# Patient Record
Sex: Female | Born: 1955 | Race: White | Hispanic: No | Marital: Married | Smoking: Never smoker
Health system: Southern US, Community
[De-identification: ages and names within clinical notes are randomized; demographics above are authoritative.]

## PROBLEM LIST (undated history)

## (undated) DIAGNOSIS — I10 Essential (primary) hypertension: Secondary | ICD-10-CM

## (undated) DIAGNOSIS — Z8249 Family history of ischemic heart disease and other diseases of the circulatory system: Secondary | ICD-10-CM

## (undated) DIAGNOSIS — N63 Unspecified lump in unspecified breast: Secondary | ICD-10-CM

## (undated) DIAGNOSIS — E785 Hyperlipidemia, unspecified: Secondary | ICD-10-CM

## (undated) DIAGNOSIS — Z973 Presence of spectacles and contact lenses: Secondary | ICD-10-CM

## (undated) HISTORY — DX: Morbid (severe) obesity due to excess calories: E66.01

## (undated) HISTORY — DX: Hyperlipidemia, unspecified: E78.5

## (undated) HISTORY — PX: TONSILLECTOMY: SUR1361

## (undated) HISTORY — DX: Unspecified lump in unspecified breast: N63.0

## (undated) HISTORY — DX: Presence of spectacles and contact lenses: Z97.3

## (undated) HISTORY — DX: Family history of ischemic heart disease and other diseases of the circulatory system: Z82.49

## (undated) HISTORY — DX: Essential (primary) hypertension: I10

---

## 2004-11-27 ENCOUNTER — Ambulatory Visit: Payer: Self-pay | Admitting: Internal Medicine

## 2004-12-09 ENCOUNTER — Ambulatory Visit (HOSPITAL_COMMUNITY): Admission: RE | Admit: 2004-12-09 | Discharge: 2004-12-09 | Payer: Self-pay | Admitting: Internal Medicine

## 2005-01-15 ENCOUNTER — Ambulatory Visit: Payer: Self-pay | Admitting: Internal Medicine

## 2006-03-17 ENCOUNTER — Ambulatory Visit: Payer: Self-pay | Admitting: Internal Medicine

## 2006-11-08 ENCOUNTER — Ambulatory Visit: Payer: Self-pay | Admitting: Internal Medicine

## 2007-05-31 ENCOUNTER — Ambulatory Visit (HOSPITAL_COMMUNITY): Admission: RE | Admit: 2007-05-31 | Discharge: 2007-05-31 | Payer: Self-pay | Admitting: Family Medicine

## 2007-06-12 ENCOUNTER — Encounter: Admission: RE | Admit: 2007-06-12 | Discharge: 2007-06-12 | Payer: Self-pay | Admitting: Family Medicine

## 2008-10-31 ENCOUNTER — Encounter: Admission: RE | Admit: 2008-10-31 | Discharge: 2008-10-31 | Payer: Self-pay | Admitting: Family Medicine

## 2008-11-20 ENCOUNTER — Encounter: Admission: RE | Admit: 2008-11-20 | Discharge: 2008-11-20 | Payer: Self-pay | Admitting: Family Medicine

## 2008-12-18 ENCOUNTER — Encounter: Admission: RE | Admit: 2008-12-18 | Discharge: 2008-12-18 | Payer: Self-pay | Admitting: General Surgery

## 2010-06-21 ENCOUNTER — Encounter: Payer: Self-pay | Admitting: Family Medicine

## 2012-01-13 ENCOUNTER — Other Ambulatory Visit (INDEPENDENT_AMBULATORY_CARE_PROVIDER_SITE_OTHER): Payer: Self-pay | Admitting: General Surgery

## 2012-01-13 ENCOUNTER — Encounter (INDEPENDENT_AMBULATORY_CARE_PROVIDER_SITE_OTHER): Payer: Self-pay | Admitting: Surgery

## 2012-01-13 ENCOUNTER — Ambulatory Visit (INDEPENDENT_AMBULATORY_CARE_PROVIDER_SITE_OTHER): Payer: BC Managed Care – PPO | Admitting: Surgery

## 2012-01-13 VITALS — BP 126/82 | HR 72 | Temp 97.8°F | Resp 16 | Ht 66.0 in | Wt 252.1 lb

## 2012-01-13 DIAGNOSIS — E7800 Pure hypercholesterolemia, unspecified: Secondary | ICD-10-CM

## 2012-01-13 DIAGNOSIS — I10 Essential (primary) hypertension: Secondary | ICD-10-CM

## 2012-01-13 DIAGNOSIS — E139 Other specified diabetes mellitus without complications: Secondary | ICD-10-CM

## 2012-01-13 DIAGNOSIS — E119 Type 2 diabetes mellitus without complications: Secondary | ICD-10-CM

## 2012-01-13 DIAGNOSIS — E66813 Obesity, class 3: Secondary | ICD-10-CM

## 2012-01-13 DIAGNOSIS — Z1231 Encounter for screening mammogram for malignant neoplasm of breast: Secondary | ICD-10-CM

## 2012-01-13 NOTE — Patient Instructions (Signed)

## 2012-01-13 NOTE — Progress Notes (Signed)
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  She has read extensively on bariatric operations and has decided she would like a laparoscopic adjustable gastric band. She is followed by Dr. Dewain Penning and [place]. Her comorbidities include in addition to diabetes her hypertension, hypercholesterolemia. She was to proceed with workup for her without adjustable gastric banding. She has had no prior abdominal surgery. She has no history of DVT.  Past Medical History  Diagnosis Date  . Diabetes mellitus   . Hypertension   . Lump in female breast   . Wears glasses     for reading    History reviewed. No pertinent past surgical history.  Current Outpatient Prescriptions  Medication Sig Dispense Refill  . aspirin 81 MG tablet Take 81 mg by mouth daily.      . fenofibrate 160 MG tablet Take 160 mg by mouth daily.      . fexofenadine (ALLERGY RELIEF) 180 MG tablet Take 180 mg by mouth daily.      . fish oil-omega-3 fatty acids 1000 MG capsule Take 2 g by mouth 2 (two) times daily.      Marland Kitchen L-Lysine 500 MG TABS Take by mouth 2 (two) times daily.      Marland Kitchen lisinopril-hydrochlorothiazide (PRINZIDE,ZESTORETIC) 20-12.5 MG per tablet Take 1 tablet by mouth daily.      . Multiple Vitamins-Calcium (ONE-A-DAY WOMENS FORMULA PO) Take by mouth daily.      . Multiple Vitamins-Minerals (OCUVITE ADULT 50+ PO) Take by mouth 2 (two) times daily.      . pioglitazone-metformin (ACTOPLUS MET) 15-850 MG per tablet Take 1 tablet by mouth 3 (three) times daily.      . pravastatin (PRAVACHOL) 40 MG tablet Take 40 mg by mouth 2 (two) times daily.       Review of patient's allergies indicates no known allergies. Family History  Problem Relation Age of Onset  . Diabetes Mother   . Heart disease Mother     congestive heart failure  . COPD Mother   . Stroke Father   . Hyperlipidemia  Brother    Social History:   reports that she has never smoked. She has never used smokeless tobacco. She reports that she drinks alcohol. She reports that she does not use illicit drugs.   REVIEW OF SYSTEMS - PERTINENT POSITIVES ONLY: noncontributory  Physical Exam:   Blood pressure 126/82, pulse 72, temperature 97.8 F (36.6 C), temperature source Temporal, resp. rate 16, height 5\' 6"  (1.676 m), weight 252 lb 2 oz (114.363 kg). Body mass index is 40.69 kg/(m^2).  Gen:  WDWN WF NAD  Neurological: Alert and oriented to person, place, and time. Motor and sensory function is grossly intact  Head: Normocephalic and atraumatic.  Eyes: Conjunctivae are normal. Pupils are equal, round, and reactive to light. No scleral icterus.  Neck: Normal range of motion. Neck supple. No tracheal deviation or thyromegaly present.  Cardiovascular:  SR without murmurs or gallops.  No carotid bruits Respiratory: Effort normal.  No respiratory distress. No chest wall tenderness. Breath sounds normal.  No wheezes, rales or rhonchi.  Abdomen:  nontender GU: Musculoskeletal: Normal range of motion. Extremities are nontender. No cyanosis, edema or clubbing noted Lymphadenopathy: No cervical, preauricular, postauricular or axillary adenopathy is present Skin: Skin is warm and dry. No rash noted. No diaphoresis. No erythema.  No pallor. Pscyh: Normal mood and affect. Behavior is normal. Judgment and thought content normal.   LABORATORY RESULTS: No results found for this or any previous visit (from the past 48 hour(s)).  RADIOLOGY RESULTS: No results found.  Problem List: Patient Active Problem List  Diagnosis  . Type 2 DM for 10 years  . Hypercholesteremia  . Hypertension  . Obesity, Class III, BMI 40-49.9 (morbid obesity)    Assessment & Plan: Obesity BMI 40 DM Plan begin workup for lapband    [name], MD, [facility] [phone] beeper [phone]  01/13/2012  11:57 AM

## 2012-01-26 ENCOUNTER — Encounter: Payer: BC Managed Care – PPO | Attending: Surgery | Admitting: *Deleted

## 2012-01-26 ENCOUNTER — Other Ambulatory Visit (HOSPITAL_COMMUNITY): Payer: Self-pay

## 2012-01-26 ENCOUNTER — Encounter: Payer: Self-pay | Admitting: *Deleted

## 2012-01-26 ENCOUNTER — Ambulatory Visit (HOSPITAL_COMMUNITY): Payer: Self-pay

## 2012-01-26 ENCOUNTER — Other Ambulatory Visit (INDEPENDENT_AMBULATORY_CARE_PROVIDER_SITE_OTHER): Payer: Self-pay | Admitting: Surgery

## 2012-01-26 VITALS — Ht 65.5 in | Wt 249.4 lb

## 2012-01-26 DIAGNOSIS — Z01818 Encounter for other preprocedural examination: Secondary | ICD-10-CM | POA: Insufficient documentation

## 2012-01-26 DIAGNOSIS — Z713 Dietary counseling and surveillance: Secondary | ICD-10-CM | POA: Insufficient documentation

## 2012-01-26 LAB — CBC
Hemoglobin: 14.5 g/dL (ref 12.0–15.0)
MCH: 30.1 pg (ref 26.0–34.0)
MCV: 87.8 fL (ref 78.0–100.0)
Platelets: 262 10*3/uL (ref 150–400)
WBC: 5.9 10*3/uL (ref 4.0–10.5)

## 2012-01-26 LAB — COMPREHENSIVE METABOLIC PANEL
Albumin: 4.4 g/dL (ref 3.5–5.2)
BUN: 15 mg/dL (ref 6–23)
Chloride: 102 mEq/L (ref 96–112)
Creat: 0.98 mg/dL (ref 0.50–1.10)
Sodium: 140 mEq/L (ref 135–145)
Total Bilirubin: 0.6 mg/dL (ref 0.3–1.2)

## 2012-01-26 LAB — TSH: TSH: 1.615 u[IU]/mL (ref 0.350–4.500)

## 2012-01-26 LAB — HEMOGLOBIN A1C: Mean Plasma Glucose: 151 mg/dL — ABNORMAL HIGH (ref <117)

## 2012-01-26 NOTE — Progress Notes (Signed)
  Pre-Op Assessment Visit:  Pre-Operative LAGB Surgery  Medical Nutrition Therapy:  Appt start time: 0800   End time:  0915.  Patient was seen on 01/26/2012 for Pre-Operative LAGB Nutrition Assessment. Assessment and letter of approval faxed to [facility] Bariatric Surgery Program coordinator on 01/26/2012.  Approval letter sent to [facility] and will be available in the chart under the media tab.  Handouts given during visit include:  Pre-Op Goals   Bariatric Surgery Protein Shakes handout  Patient to call for Pre-Op and Post-Op Nutrition Education at [facility] when surgery is scheduled.

## 2012-01-26 NOTE — Patient Instructions (Addendum)
   Follow Pre-Op Nutrition Goals to prepare for Lapband Surgery.   Call the [facility] at [phone] once you have been given your surgery date to enrolled in the Pre-Op Nutrition Class. You will need to attend this nutrition class 3-4 weeks prior to your surgery. 

## 2012-02-09 ENCOUNTER — Other Ambulatory Visit: Payer: Self-pay

## 2012-02-09 ENCOUNTER — Ambulatory Visit (HOSPITAL_COMMUNITY)
Admission: RE | Admit: 2012-02-09 | Discharge: 2012-02-09 | Disposition: A | Discharge status: Home / Self Care | Payer: BC Managed Care – PPO | Source: Ambulatory Visit | Attending: Surgery | Admitting: Surgery

## 2012-02-09 DIAGNOSIS — I10 Essential (primary) hypertension: Secondary | ICD-10-CM

## 2012-02-09 DIAGNOSIS — Z13820 Encounter for screening for osteoporosis: Secondary | ICD-10-CM | POA: Insufficient documentation

## 2012-02-09 DIAGNOSIS — E119 Type 2 diabetes mellitus without complications: Secondary | ICD-10-CM

## 2012-02-09 DIAGNOSIS — E7800 Pure hypercholesterolemia, unspecified: Secondary | ICD-10-CM

## 2012-02-09 DIAGNOSIS — K449 Diaphragmatic hernia without obstruction or gangrene: Secondary | ICD-10-CM | POA: Insufficient documentation

## 2012-02-09 DIAGNOSIS — Z1231 Encounter for screening mammogram for malignant neoplasm of breast: Secondary | ICD-10-CM | POA: Insufficient documentation

## 2012-02-09 DIAGNOSIS — Z6841 Body Mass Index (BMI) 40.0 and over, adult: Secondary | ICD-10-CM | POA: Insufficient documentation

## 2012-02-16 ENCOUNTER — Ambulatory Visit (HOSPITAL_COMMUNITY)
Admission: RE | Admit: 2012-02-16 | Discharge: 2012-02-16 | Disposition: A | Discharge status: Home / Self Care | Payer: BC Managed Care – PPO | Source: Ambulatory Visit | Attending: Surgery | Admitting: Surgery

## 2012-02-16 ENCOUNTER — Encounter (HOSPITAL_COMMUNITY)
Admission: RE | Disposition: A | Discharge status: Home / Self Care | Payer: Self-pay | Source: Ambulatory Visit | Attending: Surgery

## 2012-02-16 DIAGNOSIS — Z01818 Encounter for other preprocedural examination: Secondary | ICD-10-CM | POA: Insufficient documentation

## 2012-02-16 HISTORY — PX: BREATH TEK H PYLORI: SHX5422

## 2012-02-16 SURGERY — BREATH TEST, FOR HELICOBACTER PYLORI

## 2012-02-17 ENCOUNTER — Encounter (HOSPITAL_COMMUNITY): Payer: Self-pay | Admitting: Surgery

## 2012-02-17 ENCOUNTER — Encounter (HOSPITAL_COMMUNITY): Payer: Self-pay

## 2012-03-23 ENCOUNTER — Institutional Professional Consult (permissible substitution): Payer: BC Managed Care – PPO | Admitting: Internal Medicine

## 2012-04-03 ENCOUNTER — Encounter: Payer: Self-pay | Admitting: Internal Medicine

## 2012-04-03 ENCOUNTER — Ambulatory Visit (INDEPENDENT_AMBULATORY_CARE_PROVIDER_SITE_OTHER): Payer: BC Managed Care – PPO | Admitting: Internal Medicine

## 2012-04-03 VITALS — BP 130/86 | HR 101 | Temp 97.7°F | Ht 65.5 in | Wt 232.4 lb

## 2012-04-03 DIAGNOSIS — R053 Chronic cough: Secondary | ICD-10-CM

## 2012-04-03 DIAGNOSIS — R05 Cough: Secondary | ICD-10-CM

## 2012-04-03 DIAGNOSIS — R059 Cough, unspecified: Secondary | ICD-10-CM

## 2012-04-03 NOTE — Patient Instructions (Addendum)
Your cough could have been related to lisinopril which we have now added in your allergy list In addition, you might have had a spike in acid reflux at that time due to your abdominal symptoms Not sure you had acute bronchitis It is not clear which of the many interventions have helped your cough Given improvement in a time frame that corresponds to stopping lisinopril, this was likely cause of cough For now as discussed continue to monitor your cough I would suggest you stop fish oil for a month and then resume If your cough does not go away in a month or comes back anytime, please call us for appt

## 2012-04-03 NOTE — Progress Notes (Signed)
Subjective:    Patient ID: Sandra Villa, female    DOB: 08-Dec-1955, 56 y.o.   MRN: [id number]  HPI PCP is SPEAR, TAMMY, MD  Body mass index is 38.09 kg/(m^2).  reports that she has never smoked. She has never used smokeless tobacco.   IOV 04/03/2012  56 year female. Referred for chronic cough   Insidious onset x 6 weeks ago. No preceding URI/cold. Started randomly. Went to an urgent care; Rx prilosec x 10 days but no help. Then went to PMD who gave empiric antibiotic x 5 days for acute bronchitis (she had acute bronchitis last year same time but had other infectious symptoms then but not now). This did not help. Then  approix 2 weeks ag- developed incidental abd pain nos and 14  days ago swa PMD. AT this visit ace inhibitor for bp stopped, given 3 day prednisone for abd issue and 10 days 2nd course of antibiotics nos that did not help that she finished yesterday 03/02/12. Now cough is improved and no more all night cough. At onset cough was severe. Now cough severity is mild. Quality of cough is dry all the time. Cough made worse by sleeping or lying down. Improved by up and about. No relationship to dust and fumes. There was no associated tickle in throat or gag but there was associated wheeze that is improving as well   RElevant hx  BP  - was on lisinopril x 8 years. Stopped after onset of cough 3 weeks ago. Since then on HCTZ  Sinus  - no known sinus issues acutely or chronically  Allergies  - no known allergies - sesonal or perennial  GI - not known to have GERD - she is due for bariatric surgery to lose weight (dr Daphine Deutscher). Sept 2013: UGI series: Small sliding hiatal hernia. No evidence of esophageal stricture or other significant abnormality. - note she is on fish  Oil for lipid, denies gerd symptoms   Pulmonary  - not known to have any pulmnary diagnosis and baseline chronic resp symptoms.. CXR Sept 2013:  clear   Dr Gretta Cool Reflux Symptom Index (> 13-15 suggestive of LPR  cough) 04/03/2012   Hoarseness of problem with voice 0  Clearing  Of Throat 2  Excess throat mucus or feeling of post nasal drip 0  Difficulty swallowing food, liquid or tablets 0  Cough after eating or lying down 4  Breathing difficulties or choking episodes 0  Troublesome or annoying cough 4  Sensation of something sticking in throat or lump in throat 0  Heartburn, chest pain, indigestion, or stomach acid coming up 0  TOTAL 10     Kouffman Reflux v Neurogenic Cough Differentiator Reflux Comments  Do you awaken from a sound sleep coughing violently?                            With trouble breathing? n   Do you have choking episodes when you cannot  Get enough air, gasping for air ?              n   Do you usually cough when you lie down into  The bed, or when you just lie down to rest ?                          Yes   Do you usually cough after meals or eating?  no   Do you cough when (or after) you bend over?    no   GERD SCORE  1   Kouffman Reflux v Neurogenic Cough Differentiator Neurogenic   Do you more-or-less cough all day long? n   Does change of temperature make you cough? n   Does laughing or chuckling cause you to cough? n   Do fumes (perfume, automobile fumes, burned  Toast, etc.,) cause you to cough ?      n   Does speaking, singing, or talking on the phone cause you to cough   ?               n   Neurogenic/Airway score 0      Past Medical History  Diagnosis Date  . Diabetes mellitus   . Hypertension   . Lump in female breast   . Wears glasses     for reading  . Morbid obesity   . Hyperlipidemia     Pt reported on 01/26/12     Family History  Problem Relation Age of Onset  . Diabetes Mother   . Heart disease Mother     congestive heart failure  . COPD Mother   . Stroke Father   . Hyperlipidemia Brother      History   Social History  . Marital Status: Married    Spouse Name: N/A    Number of Children: N/A  . Years of Education: N/A    Occupational History  . Not on file.   Social History Main Topics  . Smoking status: Never Smoker   . Smokeless tobacco: Never Used  . Alcohol Use: Yes     Comment: rarely - maybe once per month if that  . Drug Use: No  . Sexually Active: Not on file   Other Topics Concern  . Not on file   Social History Narrative  . No narrative on file     Allergies  Allergen Reactions  . Niacin And Related Rash     Outpatient Prescriptions Prior to Visit  Medication Sig Dispense Refill  . aspirin 81 MG tablet Take 81 mg by mouth daily.      . fenofibrate 160 MG tablet Take 160 mg by mouth daily.      . fish oil-omega-3 fatty acids 1000 MG capsule Take 2 g by mouth 2 (two) times daily.      Marland Kitchen L-Lysine 500 MG TABS Take by mouth 2 (two) times daily.      . Multiple Vitamins-Calcium (ONE-A-DAY WOMENS FORMULA PO) Take by mouth daily.      . Multiple Vitamins-Minerals (OCUVITE ADULT 50+ PO) Take by mouth 2 (two) times daily.      . pioglitazone-metformin (ACTOPLUS MET) 15-850 MG per tablet Take 1 tablet by mouth 3 (three) times daily.      . pravastatin (PRAVACHOL) 40 MG tablet Take 40 mg by mouth 2 (two) times daily.      . [DISCONTINUED] fexofenadine (ALLERGY RELIEF) 180 MG tablet Take 180 mg by mouth daily.      . [DISCONTINUED] lisinopril-hydrochlorothiazide (PRINZIDE,ZESTORETIC) 20-12.5 MG per tablet Take 1 tablet by mouth daily.       Last reviewed on 04/03/2012 11:12 AM by Darrell Jewel, CMA    Review of Systems  Constitutional: Negative for fever and unexpected weight change.  HENT: Negative for ear pain, nosebleeds, congestion, sore throat, rhinorrhea, sneezing, trouble swallowing, dental problem, postnasal drip and sinus pressure.   Eyes: Negative for redness  and itching.  Respiratory: Positive for cough. Negative for chest tightness, shortness of breath and wheezing.   Cardiovascular: Negative for palpitations and leg swelling.  Gastrointestinal: Positive for abdominal  pain. Negative for nausea and vomiting.  Genitourinary: Negative for dysuria.  Musculoskeletal: Negative for joint swelling.  Skin: Negative for rash.  Neurological: Negative for headaches.  Hematological: Does not bruise/bleed easily.  Psychiatric/Behavioral: Negative for dysphoric mood. The patient is not nervous/anxious.        Objective:   Physical Exam  Vitals reviewed. Constitutional: She is oriented to person, place, and time. She appears well-developed and well-nourished. No distress.       Body mass index is 38.09 kg/(m^2).   HENT:  Head: Normocephalic and atraumatic.  Right Ear: External ear normal.  Left Ear: External ear normal.  Mouth/Throat: Oropharynx is clear and moist. No oropharyngeal exudate.  Eyes: Conjunctivae normal and EOM are normal. Pupils are equal, round, and reactive to light. Right eye exhibits no discharge. Left eye exhibits no discharge. No scleral icterus.  Neck: Normal range of motion. Neck supple. No JVD present. No tracheal deviation present. No thyromegaly present.  Cardiovascular: Normal rate, regular rhythm, normal heart sounds and intact distal pulses.  Exam reveals no gallop and no friction rub.   No murmur heard. Pulmonary/Chest: Effort normal and breath sounds normal. No respiratory distress. She has no wheezes. She has no rales. She exhibits no tenderness.  Abdominal: Soft. Bowel sounds are normal. She exhibits no distension and no mass. There is no tenderness. There is no rebound and no guarding.  Musculoskeletal: Normal range of motion. She exhibits no edema and no tenderness.  Lymphadenopathy:    She has no cervical adenopathy.  Neurological: She is alert and oriented to person, place, and time. She has normal reflexes. No cranial nerve deficit. She exhibits normal muscle tone. Coordination normal.  Skin: Skin is warm and dry. No rash noted. She is not diaphoretic. No erythema. No pallor.  Psychiatric: She has a normal mood and affect. Her  behavior is normal. Judgment and thought content normal.          Assessment & Plan:

## 2012-04-16 DIAGNOSIS — R053 Chronic cough: Secondary | ICD-10-CM | POA: Insufficient documentation

## 2012-04-16 DIAGNOSIS — R05 Cough: Secondary | ICD-10-CM | POA: Insufficient documentation

## 2012-04-16 NOTE — Assessment & Plan Note (Signed)
Your cough could have been related to lisinopril which we have now added in your allergy list In addition, you might have had a spike in acid reflux at that time due to your abdominal symptoms Not sure you had acute bronchitis It is not clear which of the many interventions have helped your cough Given improvement in a time frame that corresponds to stopping lisinopril, this was likely cause of cough For now as discussed continue to monitor your cough I would suggest you stop fish oil for a month and then resume If your cough does not go away in a month or comes back anytime, please call us for appt 

## 2012-05-04 ENCOUNTER — Encounter: Payer: BC Managed Care – PPO | Attending: Surgery | Admitting: *Deleted

## 2012-05-04 DIAGNOSIS — Z713 Dietary counseling and surveillance: Secondary | ICD-10-CM | POA: Insufficient documentation

## 2012-05-04 DIAGNOSIS — Z01818 Encounter for other preprocedural examination: Secondary | ICD-10-CM | POA: Insufficient documentation

## 2012-05-04 NOTE — Progress Notes (Signed)
Bariatric Class:  Appt start time: 1730 end time:  1830.  Pre-Operative Nutrition Class  Patient was seen on 05/04/12 for Pre-Operative Bariatric Surgery Education at [facility].   Surgery date: 05/22/12 Surgery type: LAGB Start weight at [facility]: 249.4 lb (01/26/12) Goal weight: 140 lbs  Weight today: TBA Weight change:  TBA Total weight lost:  TBA BMI:  TBA  Samples given per MNT protocol: Bariatric Advantage Multivitamin Lot # [id number];  Exp: 06/15  Bariatric Advantage Calcium Citrate Lot # [id number];  Exp: 12/13 (*Pt alerted to upcoming expiration date)  Celebrate Vitamins Multivitamin Lot # [id number];  Exp: 07/14  Celebrate Vitamins Iron + C (18 mg) Lot # [id number];  Exp: 03/15  Celebrate Vitamins Calcium Citrate Lot # [id number];  Exp: 08/15  Unjury Protein Shake Lot # [id number];  Exp: 02/15  The following the learning objective met by the patient during this course:  Identifies Pre-Op Dietary Goals and will begin 2 weeks pre-operatively  Identifies appropriate sources of fluids and proteins   States protein recommendations and appropriate sources pre and post-operatively  Identifies Post-Operative Dietary Goals and will follow for 2 weeks post-operatively  Identifies appropriate multivitamin and calcium sources  Describes the need for physical activity post-operatively and will follow MD recommendations  States when to call healthcare provider regarding medication questions or post-operative complications  Handouts given during class include:  Pre-Op Bariatric Surgery Diet Handout  Protein Shake Handout  Post-Op Bariatric Surgery Nutrition Handout  BELT Program Information Flyer  Support Group Information Flyer  [facility] Bariatric Supplements Price List  Follow-Up Plan: Patient will follow-up at [facility] 2 weeks post operatively for diet advancement per MD.

## 2012-05-06 NOTE — Patient Instructions (Signed)
Follow:   Pre-Op Diet per MD 2 weeks prior to surgery  Phase 2- Liquids (clear/full) 2 weeks after surgery  Vitamin/Mineral/Calcium guidelines for purchasing bariatric supplements  Exercise guidelines pre and post-op per MD  Follow-up at [facility] in 2 weeks post-op for diet advancement. Contact Seham Gardenhire as needed with questions/concerns. 

## 2012-05-12 ENCOUNTER — Encounter (HOSPITAL_COMMUNITY): Payer: Self-pay | Admitting: Pharmacy Technician

## 2012-05-12 ENCOUNTER — Other Ambulatory Visit (INDEPENDENT_AMBULATORY_CARE_PROVIDER_SITE_OTHER): Payer: Self-pay | Admitting: Surgery

## 2012-05-16 ENCOUNTER — Encounter (HOSPITAL_COMMUNITY): Payer: Self-pay

## 2012-05-16 ENCOUNTER — Encounter (HOSPITAL_COMMUNITY)
Admission: RE | Admit: 2012-05-16 | Discharge: 2012-05-16 | Disposition: A | Discharge status: Home / Self Care | Payer: BC Managed Care – PPO | Source: Ambulatory Visit | Attending: Surgery | Admitting: Surgery

## 2012-05-16 LAB — SURGICAL PCR SCREEN: MRSA, PCR: NEGATIVE

## 2012-05-16 LAB — CBC
HCT: 45.7 % (ref 36.0–46.0)
Hemoglobin: 15.3 g/dL — ABNORMAL HIGH (ref 12.0–15.0)
MCH: 29.3 pg (ref 26.0–34.0)
MCHC: 33.5 g/dL (ref 30.0–36.0)
MCV: 87.4 fL (ref 78.0–100.0)

## 2012-05-16 LAB — BASIC METABOLIC PANEL
BUN: 25 mg/dL — ABNORMAL HIGH (ref 6–23)
Calcium: 10.9 mg/dL — ABNORMAL HIGH (ref 8.4–10.5)
Creatinine, Ser: 0.82 mg/dL (ref 0.50–1.10)
GFR calc non Af Amer: 79 mL/min — ABNORMAL LOW (ref >90)
Glucose, Bld: 129 mg/dL — ABNORMAL HIGH (ref 70–99)

## 2012-05-16 NOTE — Progress Notes (Signed)
Abnormal BMET results routed to Dr. Daphine Deutscher via [facility]

## 2012-05-16 NOTE — Patient Instructions (Addendum)
20 Sandra Villa  05/16/2012   Your procedure is scheduled on: 05/22/12  Report to [facility] at 12:45 PM.  Call this number if you have problems the morning of surgery [phone]: [phone]   Remember: follow bowl prep instructions and complete fleets enema night of 05/21/12   Do not eat food After Midnight 05/21/12 clear liquids from midnight until 0915 on 05/22/12 then nothing.     Take these medicines the morning of surgery with A SIP OF WATER: pravastatin    Do not wear jewelry, make-up or nail polish.  Do not wear lotions, powders, or perfumes. You may wear deodorant.  Do not shave 48 hours prior to surgery. Men may shave face and neck.  Do not bring valuables to the hospital.  Contacts, dentures or bridgework may not be worn into surgery.  Leave suitcase in the car. After surgery it may be brought to your room.  For patients admitted to the hospital, checkout time is 11:00 AM the day of discharge.     Special Instructions: Shower using CHG 2 nights before surgery and the night before surgery.  If you shower the day of surgery use CHG.  Use special wash - you have one bottle of CHG for all showers.  You should use approximately 1/3 of the bottle for each shower.   Please read over the following fact sheets that you were given: MRSA Information, clear liquids fact sheet Birdie Sons, RN  pre op nurse call if needed [phone]    FAILURE TO FOLLOW THESE INSTRUCTIONS MAY RESULT IN CANCELLATION OF YOUR SURGERY   Patient Signature: ___________________________________________

## 2012-05-16 NOTE — Progress Notes (Signed)
Chest x-ray 02/09/12 on EP[facility], EKG 02/09/12 on EP[facility]

## 2012-05-16 NOTE — Progress Notes (Signed)
05/16/12 0837  OBSTRUCTIVE SLEEP APNEA  Have you ever been diagnosed with sleep apnea through a sleep study? No  Do you snore loudly (loud enough to be heard through closed doors)?  1  Do you often feel tired, fatigued, or sleepy during the daytime? 0  Has anyone observed you stop breathing during your sleep? 0  Do you have, or are you being treated for high blood pressure? 1  BMI more than 35 kg/m2? 1  Age over 55 years old? 1  Neck circumference greater than 40 cm/18 inches? 0  Gender: 0  Obstructive Sleep Apnea Score 4   Score 4 or greater  Results sent to PCP

## 2012-05-19 ENCOUNTER — Ambulatory Visit (INDEPENDENT_AMBULATORY_CARE_PROVIDER_SITE_OTHER): Payer: BC Managed Care – PPO | Admitting: Surgery

## 2012-05-19 ENCOUNTER — Encounter (INDEPENDENT_AMBULATORY_CARE_PROVIDER_SITE_OTHER): Payer: Self-pay | Admitting: Surgery

## 2012-05-19 VITALS — BP 114/76 | HR 99 | Temp 97.0°F | Ht 66.0 in | Wt 222.0 lb

## 2012-05-19 DIAGNOSIS — E669 Obesity, unspecified: Secondary | ICD-10-CM

## 2012-05-19 NOTE — Patient Instructions (Signed)

## 2012-05-19 NOTE — Progress Notes (Signed)
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  She has read extensively on bariatric operations and has decided she would like a laparoscopic adjustable gastric band. She is followed by Sandra Villa and Sandra Villa. Her comorbidities include in addition to diabetes her hypertension, hypercholesterolemia. She was to proceed with workup for her without adjustable gastric banding. She has had no prior abdominal surgery. She has no history of DVT. She had an upper GI that showed: Small sliding hiatal hernia. No evidence of esophageal stricture  or other significant abnormality. She has no complaints of hearburn.    Past Medical History  Diagnosis Date  . Hypertension   . Lump in female breast   . Wears glasses     for reading  . Morbid obesity   . Hyperlipidemia     Pt reported on 01/26/12  . Diabetes mellitus     type 2    Past Surgical History  Procedure Date  . Breath tek h pylori 02/16/2012    Procedure: BREATH TEK H PYLORI;  Surgeon: Sandra Mccalla B Derico Mitton, MD;  Location: [facility];  Service: General;  Laterality: N/A;  . Tonsillectomy 56years old    Current Outpatient Prescriptions  Medication Sig Dispense Refill  . calcium carbonate (OS-CAL) 600 MG TABS Take 600 mg by mouth 3 (three) times daily with meals.      . fenofibrate 160 MG tablet Take 160 mg by mouth daily before breakfast.       . hydrochlorothiazide (HYDRODIURIL) 25 MG tablet Take 1 tablet by mouth daily before breakfast.       . L-Lysine 500 MG TABS Take by mouth 2 (two) times daily.      . Multiple Vitamin (MULTIVITAMIN) LIQD Take 15 mLs by mouth daily.      . Multiple Vitamins-Minerals (OCUVITE ADULT 50+ PO) Take by mouth 2 (two) times daily.      . pioglitazone-metformin (ACTOPLUS MET) 15-850 MG per tablet Take 1 tablet by mouth 2 (two) times daily with a meal.       . pravastatin  (PRAVACHOL) 40 MG tablet Take 40 mg by mouth 2 (two) times daily.       Lisinopril and Niacin and related Family History  Problem Relation Age of Onset  . Diabetes Mother   . Heart disease Mother     congestive heart failure  . COPD Mother   . Stroke Father   . Hyperlipidemia Brother    Social History:   reports that she has never smoked. She has never used smokeless tobacco. She reports that she drinks alcohol. She reports that she does not use illicit drugs.   REVIEW OF SYSTEMS - PERTINENT POSITIVES ONLY: noncontributory  Physical Exam:   Blood pressure 114/76, pulse 99, temperature 97 F (36.1 C), temperature source Temporal, height 5' 6" (1.676 m), weight 222 lb (100.699 kg), SpO2 96.00%. Body mass index is 35.83 kg/(m^2).  Gen:  WDWN WF NAD  Neurological: Alert and oriented to person, place, and time. Motor and sensory function is grossly intact  Head: Normocephalic and atraumatic.  Eyes: Conjunctivae are normal. Pupils are equal, round, and reactive to light. No scleral icterus.  Neck: Normal range of motion. Neck supple. No tracheal deviation or thyromegaly present.  Cardiovascular:  SR without murmurs or gallops.  No carotid bruits Respiratory: Effort normal.  No   respiratory distress. No chest wall tenderness. Breath sounds normal.  No wheezes, rales or rhonchi.  Abdomen:  nontender GU: Musculoskeletal: Normal range of motion. Extremities are nontender. No cyanosis, edema or clubbing noted Lymphadenopathy: No cervical, preauricular, postauricular or axillary adenopathy is present Skin: Skin is warm and dry. No rash noted. No diaphoresis. No erythema. No pallor. Pscyh: Normal mood and affect. Behavior is normal. Judgment and thought content normal.   LABORATORY RESULTS: No results found for this or any previous visit (from the past 48 hour(s)).  RADIOLOGY RESULTS: No results found.  Problem List: Patient Active Problem List  Diagnosis  . Type 2 DM for 10 years  .  Hypercholesteremia  . Hypertension  . Obesity, Class III, BMI 40-49.9 (morbid obesity)  . Chronic cough    Assessment & Plan: She has done well and has lost from a BMI of 40 down to about BMI of 35. She has a small sliding hiatal hernia on upper GI and doesn't complain of GERD. Plan laparoscopic adjustable gastric banding. She's aware the procedure. Gallbladder ultrasound was negative.  Sandra B. Blakelyn Dinges, MD, FACS  [facility] [phone] beeper [phone]  05/19/2012 11:11 AM     

## 2012-05-22 ENCOUNTER — Observation Stay (HOSPITAL_COMMUNITY)
Admission: RE | Admit: 2012-05-22 | Discharge: 2012-05-23 | Disposition: A | Discharge status: Home / Self Care | Payer: BC Managed Care – PPO | Source: Ambulatory Visit | Attending: Surgery | Admitting: Surgery

## 2012-05-22 ENCOUNTER — Ambulatory Visit (HOSPITAL_COMMUNITY): Payer: BC Managed Care – PPO | Admitting: Anesthesiology

## 2012-05-22 ENCOUNTER — Encounter (HOSPITAL_COMMUNITY)
Admission: RE | Disposition: A | Discharge status: Home / Self Care | Payer: Self-pay | Source: Ambulatory Visit | Attending: Surgery

## 2012-05-22 ENCOUNTER — Encounter (HOSPITAL_COMMUNITY): Payer: Self-pay | Admitting: Anesthesiology

## 2012-05-22 ENCOUNTER — Encounter (HOSPITAL_COMMUNITY): Payer: Self-pay | Admitting: *Deleted

## 2012-05-22 DIAGNOSIS — Z6835 Body mass index (BMI) 35.0-35.9, adult: Secondary | ICD-10-CM

## 2012-05-22 DIAGNOSIS — E119 Type 2 diabetes mellitus without complications: Secondary | ICD-10-CM | POA: Insufficient documentation

## 2012-05-22 DIAGNOSIS — K449 Diaphragmatic hernia without obstruction or gangrene: Secondary | ICD-10-CM | POA: Insufficient documentation

## 2012-05-22 DIAGNOSIS — I10 Essential (primary) hypertension: Secondary | ICD-10-CM

## 2012-05-22 DIAGNOSIS — E669 Obesity, unspecified: Secondary | ICD-10-CM

## 2012-05-22 DIAGNOSIS — Z01812 Encounter for preprocedural laboratory examination: Secondary | ICD-10-CM | POA: Insufficient documentation

## 2012-05-22 DIAGNOSIS — Z79899 Other long term (current) drug therapy: Secondary | ICD-10-CM | POA: Insufficient documentation

## 2012-05-22 DIAGNOSIS — E7800 Pure hypercholesterolemia, unspecified: Secondary | ICD-10-CM | POA: Insufficient documentation

## 2012-05-22 HISTORY — PX: LAPAROSCOPIC GASTRIC BANDING: SHX1100

## 2012-05-22 HISTORY — PX: INSERTION OF MESH: SHX5868

## 2012-05-22 LAB — CBC
HCT: 41.8 % (ref 36.0–46.0)
Hemoglobin: 14.2 g/dL (ref 12.0–15.0)
MCHC: 34 g/dL (ref 30.0–36.0)
MCV: 86 fL (ref 78.0–100.0)
RDW: 13.4 % (ref 11.5–15.5)
WBC: 6.6 10*3/uL (ref 4.0–10.5)

## 2012-05-22 LAB — GLUCOSE, CAPILLARY
Glucose-Capillary: 169 mg/dL — ABNORMAL HIGH (ref 70–99)
Glucose-Capillary: 160 mg/dL — ABNORMAL HIGH (ref 70–99)
Glucose-Capillary: 108 mg/dL — ABNORMAL HIGH (ref 70–99)

## 2012-05-22 LAB — CREATININE, SERUM: GFR calc Af Amer: 72 mL/min — ABNORMAL LOW (ref >90)

## 2012-05-22 SURGERY — GASTRIC BANDING, LAPAROSCOPIC
Anesthesia: General | Site: Abdomen | Wound class: Clean

## 2012-05-22 MED ORDER — INSULIN ASPART 100 UNIT/ML ~~LOC~~ SOLN
0.0000 [IU] | SUBCUTANEOUS | Status: DC
  Administered 2012-05-22 (×2): 4 [IU] via SUBCUTANEOUS

## 2012-05-22 MED ORDER — ACETAMINOPHEN 10 MG/ML IV SOLN
INTRAVENOUS | Status: DC | PRN
  Administered 2012-05-22: 1000 mg via INTRAVENOUS

## 2012-05-22 MED ORDER — CISATRACURIUM BESYLATE (PF) 10 MG/5ML IV SOLN
INTRAVENOUS | Status: DC | PRN
  Administered 2012-05-22: 8 mg via INTRAVENOUS

## 2012-05-22 MED ORDER — UNJURY CHOCOLATE CLASSIC POWDER: 2.0000 [oz_av] | Freq: Four times a day (QID) | ORAL | Status: DC

## 2012-05-22 MED ORDER — UNJURY VANILLA POWDER
2.0000 [oz_av] | Freq: Four times a day (QID) | ORAL | Status: DC
  Administered 2012-05-23: 2 [oz_av] via ORAL

## 2012-05-22 MED ORDER — INSULIN GLARGINE 100 UNIT/ML ~~LOC~~ SOLN
5.0000 [IU] | Freq: Every day | SUBCUTANEOUS | Status: DC
  Administered 2012-05-22: 5 [IU] via SUBCUTANEOUS

## 2012-05-22 MED ORDER — CEFOXITIN SODIUM-DEXTROSE 1-4 GM-% IV SOLR (PREMIX)
INTRAVENOUS | Status: AC
  Filled 2012-05-22: qty 100

## 2012-05-22 MED ORDER — ACETAMINOPHEN 10 MG/ML IV SOLN
1000.0000 mg | Freq: Four times a day (QID) | INTRAVENOUS | Status: DC
  Administered 2012-05-22 – 2012-05-23 (×3): 1000 mg via INTRAVENOUS
  Filled 2012-05-22 (×6): qty 100

## 2012-05-22 MED ORDER — NEOSTIGMINE METHYLSULFATE 1 MG/ML IJ SOLN
INTRAMUSCULAR | Status: DC | PRN
  Administered 2012-05-22: 4 mg via INTRAVENOUS

## 2012-05-22 MED ORDER — ONDANSETRON HCL 4 MG/2ML IJ SOLN
4.0000 mg | INTRAMUSCULAR | Status: DC | PRN
  Administered 2012-05-22: 4 mg via INTRAVENOUS
  Filled 2012-05-22: qty 2

## 2012-05-22 MED ORDER — OXYCODONE HCL 5 MG/5ML PO SOLN
5.0000 mg | Freq: Once | ORAL | Status: DC | PRN
  Filled 2012-05-22: qty 5

## 2012-05-22 MED ORDER — UNJURY CHICKEN SOUP POWDER: 2.0000 [oz_av] | Freq: Four times a day (QID) | ORAL | Status: DC

## 2012-05-22 MED ORDER — GLYCOPYRROLATE 0.2 MG/ML IJ SOLN
INTRAMUSCULAR | Status: DC | PRN
  Administered 2012-05-22: .6 mg via INTRAVENOUS

## 2012-05-22 MED ORDER — BUPIVACAINE LIPOSOME 1.3 % IJ SUSP
INTRAMUSCULAR | Status: DC | PRN
  Administered 2012-05-22: 20 mL

## 2012-05-22 MED ORDER — ACETAMINOPHEN 160 MG/5ML PO SOLN: 650.0000 mg | ORAL | Status: DC | PRN

## 2012-05-22 MED ORDER — ACETAMINOPHEN 10 MG/ML IV SOLN
INTRAVENOUS | Status: AC
  Filled 2012-05-22: qty 100

## 2012-05-22 MED ORDER — DEXAMETHASONE SODIUM PHOSPHATE 10 MG/ML IJ SOLN
INTRAMUSCULAR | Status: DC | PRN
  Administered 2012-05-22: 10 mg via INTRAVENOUS

## 2012-05-22 MED ORDER — LACTATED RINGERS IV SOLN
INTRAVENOUS | Status: DC | PRN
  Administered 2012-05-22 (×2): via INTRAVENOUS

## 2012-05-22 MED ORDER — MEPERIDINE HCL 50 MG/ML IJ SOLN: 6.2500 mg | INTRAMUSCULAR | Status: DC | PRN

## 2012-05-22 MED ORDER — PROMETHAZINE HCL 25 MG/ML IJ SOLN: 6.2500 mg | INTRAMUSCULAR | Status: DC | PRN

## 2012-05-22 MED ORDER — OXYCODONE-ACETAMINOPHEN 5-325 MG/5ML PO SOLN: 5.0000 mL | ORAL | Status: DC | PRN

## 2012-05-22 MED ORDER — MORPHINE SULFATE 2 MG/ML IJ SOLN: 2.0000 mg | INTRAMUSCULAR | Status: DC | PRN

## 2012-05-22 MED ORDER — FLEET ENEMA 7-19 GM/118ML RE ENEM: 1.0000 | ENEMA | Freq: Once | RECTAL | Status: DC

## 2012-05-22 MED ORDER — ONDANSETRON HCL 4 MG/2ML IJ SOLN
INTRAMUSCULAR | Status: DC | PRN
  Administered 2012-05-22: 4 mg via INTRAVENOUS

## 2012-05-22 MED ORDER — HYDROMORPHONE HCL PF 1 MG/ML IJ SOLN: 0.2500 mg | INTRAMUSCULAR | Status: DC | PRN

## 2012-05-22 MED ORDER — DEXTROSE 5 % IV SOLN
2.0000 g | INTRAVENOUS | Status: AC
  Administered 2012-05-22: 2 g via INTRAVENOUS
  Filled 2012-05-22: qty 2

## 2012-05-22 MED ORDER — SODIUM CHLORIDE 0.9 % IV SOLN
INTRAVENOUS | Status: DC
  Administered 2012-05-22: 100 mL via INTRAVENOUS

## 2012-05-22 MED ORDER — PROPOFOL 10 MG/ML IV BOLUS
INTRAVENOUS | Status: DC | PRN
  Administered 2012-05-22: 200 mg via INTRAVENOUS

## 2012-05-22 MED ORDER — LIDOCAINE HCL (CARDIAC) 20 MG/ML IV SOLN
INTRAVENOUS | Status: DC | PRN
  Administered 2012-05-22: 100 mg via INTRAVENOUS

## 2012-05-22 MED ORDER — HEPARIN SODIUM (PORCINE) 5000 UNIT/ML IJ SOLN
5000.0000 [IU] | Freq: Three times a day (TID) | INTRAMUSCULAR | Status: DC
  Administered 2012-05-22 – 2012-05-23 (×2): 5000 [IU] via SUBCUTANEOUS
  Filled 2012-05-22 (×5): qty 1

## 2012-05-22 MED ORDER — HEPARIN SODIUM (PORCINE) 5000 UNIT/ML IJ SOLN
5000.0000 [IU] | Freq: Once | INTRAMUSCULAR | Status: AC
  Administered 2012-05-22: 5000 [IU] via SUBCUTANEOUS
  Filled 2012-05-22: qty 1

## 2012-05-22 MED ORDER — SUCCINYLCHOLINE CHLORIDE 20 MG/ML IJ SOLN
INTRAMUSCULAR | Status: DC | PRN
  Administered 2012-05-22: 100 mg via INTRAVENOUS

## 2012-05-22 MED ORDER — MIDAZOLAM HCL 5 MG/5ML IJ SOLN
INTRAMUSCULAR | Status: DC | PRN
  Administered 2012-05-22: 2 mg via INTRAVENOUS

## 2012-05-22 MED ORDER — SUFENTANIL CITRATE 50 MCG/ML IV SOLN
INTRAVENOUS | Status: DC | PRN
  Administered 2012-05-22 (×2): 10 ug via INTRAVENOUS
  Administered 2012-05-22: 20 ug via INTRAVENOUS
  Administered 2012-05-22: 10 ug via INTRAVENOUS

## 2012-05-22 MED ORDER — SODIUM CHLORIDE 0.9 % IJ SOLN
INTRAMUSCULAR | Status: DC | PRN
  Administered 2012-05-22: 20 mL via INTRAVENOUS

## 2012-05-22 MED ORDER — BUPIVACAINE LIPOSOME 1.3 % IJ SUSP
20.0000 mL | Freq: Once | INTRAMUSCULAR | Status: DC
  Filled 2012-05-22: qty 20

## 2012-05-22 MED ORDER — ACETAMINOPHEN 10 MG/ML IV SOLN: 1000.0000 mg | Freq: Once | INTRAVENOUS | Status: DC | PRN

## 2012-05-22 MED ORDER — OXYCODONE HCL 5 MG PO TABS: 5.0000 mg | ORAL_TABLET | Freq: Once | ORAL | Status: DC | PRN

## 2012-05-22 SURGICAL SUPPLY — 60 items
BAND LAP 10.0 W/TUBES (Band) ×3 IMPLANT
BENZOIN TINCTURE PRP APPL 2/3 (GAUZE/BANDAGES/DRESSINGS) IMPLANT
BLADE HEX COATED 2.75 (ELECTRODE) ×3 IMPLANT
BLADE SURG 15 STRL LF DISP TIS (BLADE) ×2 IMPLANT
BLADE SURG 15 STRL SS (BLADE) ×1
CANISTER SUCTION 2500CC (MISCELLANEOUS) ×3 IMPLANT
CLOTH BEACON ORANGE TIMEOUT ST (SAFETY) ×3 IMPLANT
COVER SURGICAL LIGHT HANDLE (MISCELLANEOUS) ×3 IMPLANT
DECANTER SPIKE VIAL GLASS SM (MISCELLANEOUS) ×6 IMPLANT
DEVICE SUT QUICK LOAD TK 5 (STAPLE) ×15 IMPLANT
DEVICE SUT TI-KNOT TK 5X26 (MISCELLANEOUS) ×3 IMPLANT
DEVICE SUTURE ENDOST 10MM (ENDOMECHANICALS) ×3 IMPLANT
DISSECTOR BLUNT TIP ENDO 5MM (MISCELLANEOUS) ×3 IMPLANT
DRAPE CAMERA CLOSED 9X96 (DRAPES) ×3 IMPLANT
ELECT REM PT RETURN 9FT ADLT (ELECTROSURGICAL) ×3
ELECTRODE REM PT RTRN 9FT ADLT (ELECTROSURGICAL) ×2 IMPLANT
GLOVE BIOGEL M 8.0 STRL (GLOVE) ×3 IMPLANT
GLOVE BIOGEL PI IND STRL 7.0 (GLOVE) ×2 IMPLANT
GLOVE BIOGEL PI INDICATOR 7.0 (GLOVE) ×1
GOWN STRL NON-REIN LRG LVL3 (GOWN DISPOSABLE) ×6 IMPLANT
GOWN STRL REIN XL XLG (GOWN DISPOSABLE) ×9 IMPLANT
HOVERMATT SINGLE USE (MISCELLANEOUS) ×3 IMPLANT
KIT BASIN OR (CUSTOM PROCEDURE TRAY) ×3 IMPLANT
MESH HERNIA 1X4 RECT BARD (Mesh General) ×2 IMPLANT
MESH HERNIA BARD 1X4 (Mesh General) ×1 IMPLANT
NEEDLE SPNL 22GX3.5 QUINCKE BK (NEEDLE) ×3 IMPLANT
NS IRRIG 1000ML POUR BTL (IV SOLUTION) ×3 IMPLANT
PACK UNIVERSAL I (CUSTOM PROCEDURE TRAY) ×3 IMPLANT
PENCIL BUTTON HOLSTER BLD 10FT (ELECTRODE) ×3 IMPLANT
SCALPEL HARMONIC ACE (MISCELLANEOUS) IMPLANT
SET IRRIG TUBING LAPAROSCOPIC (IRRIGATION / IRRIGATOR) IMPLANT
SHEARS CURVED HARMONIC AC 45CM (MISCELLANEOUS) IMPLANT
SLEEVE ADV FIXATION 5X100MM (TROCAR) ×3 IMPLANT
SLEEVE Z-THREAD 5X100MM (TROCAR) IMPLANT
SOLUTION ANTI FOG 6CC (MISCELLANEOUS) ×3 IMPLANT
SPONGE GAUZE 4X4 12PLY (GAUZE/BANDAGES/DRESSINGS) ×3 IMPLANT
SPONGE LAP 18X18 X RAY DECT (DISPOSABLE) ×3 IMPLANT
STAPLER VISISTAT 35W (STAPLE) ×3 IMPLANT
STRIP CLOSURE SKIN 1/2X4 (GAUZE/BANDAGES/DRESSINGS) IMPLANT
SUT ETHIBOND 2 0 SH (SUTURE) ×3
SUT ETHIBOND 2 0 SH 36X2 (SUTURE) ×6 IMPLANT
SUT PROLENE 2 0 CT2 30 (SUTURE) ×3 IMPLANT
SUT SILK 0 (SUTURE) ×1
SUT SILK 0 30XBRD TIE 6 (SUTURE) ×2 IMPLANT
SUT SURGIDAC NAB ES-9 0 48 120 (SUTURE) ×3 IMPLANT
SUT VIC AB 2-0 SH 27 (SUTURE)
SUT VIC AB 2-0 SH 27X BRD (SUTURE) IMPLANT
SUT VIC AB 4-0 SH 18 (SUTURE) ×3 IMPLANT
SYR 20CC LL (SYRINGE) ×3 IMPLANT
SYR 30ML LL (SYRINGE) ×3 IMPLANT
SYS KII OPTICAL ACCESS 15MM (TROCAR) ×3
SYSTEM KII OPTICAL ACCESS 15MM (TROCAR) ×2 IMPLANT
TOWEL OR 17X26 10 PK STRL BLUE (TOWEL DISPOSABLE) ×6 IMPLANT
TROCAR ADV FIXATION 11X100MM (TROCAR) IMPLANT
TROCAR XCEL NON-BLD 11X100MML (ENDOMECHANICALS) ×3 IMPLANT
TROCAR Z-THREAD FIOS 11X100 BL (TROCAR) IMPLANT
TROCAR Z-THREAD FIOS 5X100MM (TROCAR) ×3 IMPLANT
TROCAR Z-THREAD SLEEVE 11X100 (TROCAR) ×3 IMPLANT
TUBE CALIBRATION LAPBAND (TUBING) ×3 IMPLANT
TUBING INSUFFLATION 10FT LAP (TUBING) ×3 IMPLANT

## 2012-05-22 NOTE — H&P (View-Only) (Signed)
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  She has read extensively on bariatric operations and has decided she would like a laparoscopic adjustable gastric band. She is followed by Dr. Dewain Penning and [place]. Her comorbidities include in addition to diabetes her hypertension, hypercholesterolemia. She was to proceed with workup for her without adjustable gastric banding. She has had no prior abdominal surgery. She has no history of DVT. She had an upper GI that showed: Small sliding hiatal hernia. No evidence of esophageal stricture  or other significant abnormality. She has no complaints of hearburn.    Past Medical History  Diagnosis Date  . Hypertension   . Lump in female breast   . Wears glasses     for reading  . Morbid obesity   . Hyperlipidemia     Pt reported on 01/26/12  . Diabetes mellitus     type 2    Past Surgical History  Procedure Date  . Breath tek h pylori 02/16/2012    Procedure: BREATH TEK H PYLORI;  Surgeon: Valarie Merino, MD;  Location: [facility];  Service: General;  Laterality: N/A;  . Tonsillectomy 56years old    Current Outpatient Prescriptions  Medication Sig Dispense Refill  . calcium carbonate (OS-CAL) 600 MG TABS Take 600 mg by mouth 3 (three) times daily with meals.      . fenofibrate 160 MG tablet Take 160 mg by mouth daily before breakfast.       . hydrochlorothiazide (HYDRODIURIL) 25 MG tablet Take 1 tablet by mouth daily before breakfast.       . L-Lysine 500 MG TABS Take by mouth 2 (two) times daily.      . Multiple Vitamin (MULTIVITAMIN) LIQD Take 15 mLs by mouth daily.      . Multiple Vitamins-Minerals (OCUVITE ADULT 50+ PO) Take by mouth 2 (two) times daily.      . pioglitazone-metformin (ACTOPLUS MET) 15-850 MG per tablet Take 1 tablet by mouth 2 (two) times daily with a meal.       . pravastatin  (PRAVACHOL) 40 MG tablet Take 40 mg by mouth 2 (two) times daily.       Lisinopril and Niacin and related Family History  Problem Relation Age of Onset  . Diabetes Mother   . Heart disease Mother     congestive heart failure  . COPD Mother   . Stroke Father   . Hyperlipidemia Brother    Social History:   reports that she has never smoked. She has never used smokeless tobacco. She reports that she drinks alcohol. She reports that she does not use illicit drugs.   REVIEW OF SYSTEMS - PERTINENT POSITIVES ONLY: noncontributory  Physical Exam:   Blood pressure 114/76, pulse 99, temperature 97 F (36.1 C), temperature source Temporal, height 5\' 6"  (1.676 m), weight 222 lb (100.699 kg), SpO2 96.00%. Body mass index is 35.83 kg/(m^2).  Gen:  WDWN WF NAD  Neurological: Alert and oriented to person, place, and time. Motor and sensory function is grossly intact  Head: Normocephalic and atraumatic.  Eyes: Conjunctivae are normal. Pupils are equal, round, and reactive to light. No scleral icterus.  Neck: Normal range of motion. Neck supple. No tracheal deviation or thyromegaly present.  Cardiovascular:  SR without murmurs or gallops.  No carotid bruits Respiratory: Effort normal.  No  respiratory distress. No chest wall tenderness. Breath sounds normal.  No wheezes, rales or rhonchi.  Abdomen:  nontender GU: Musculoskeletal: Normal range of motion. Extremities are nontender. No cyanosis, edema or clubbing noted Lymphadenopathy: No cervical, preauricular, postauricular or axillary adenopathy is present Skin: Skin is warm and dry. No rash noted. No diaphoresis. No erythema. No pallor. Pscyh: Normal mood and affect. Behavior is normal. Judgment and thought content normal.   LABORATORY RESULTS: No results found for this or any previous visit (from the past 48 hour(s)).  RADIOLOGY RESULTS: No results found.  Problem List: Patient Active Problem List  Diagnosis  . Type 2 DM for 10 years  .  Hypercholesteremia  . Hypertension  . Obesity, Class III, BMI 40-49.9 (morbid obesity)  . Chronic cough    Assessment & Plan: She has done well and has lost from a BMI of 40 down to about BMI of 35. She has a small sliding hiatal hernia on upper GI and doesn't complain of GERD. Plan laparoscopic adjustable gastric banding. She's aware the procedure. Gallbladder ultrasound was negative.  [name], MD, [facility] [phone] beeper [phone]  05/19/2012 11:11 AM

## 2012-05-22 NOTE — Anesthesia Postprocedure Evaluation (Signed)
Anesthesia Post Note  Patient: Sandra Villa  Procedure(s) Performed: Procedure(s) (LRB): LAPAROSCOPIC GASTRIC BANDING (N/A) INSERTION OF MESH ()  Anesthesia type: General  Patient location: PACU  Post pain: Pain level controlled  Post assessment: Post-op Vital signs reviewed  Last Vitals: BP 134/69  Pulse 74  Temp 36.6 C (Oral)  Resp 16  Ht 5\' 6"  (1.676 m)  Wt 217 lb 6 oz (98.601 kg)  BMI 35.09 kg/m2  SpO2 93%  Post vital signs: Reviewed  Level of consciousness: sedated  Complications: No apparent anesthesia complications

## 2012-05-22 NOTE — Op Note (Signed)
05/22/2012  Surgeon: Wenda Low, MD, FACS Asst:  Lodema Pilot, DO  Procedure: Laparoscopic adjustable gastric banding with APS band and 1 suture closure of the hiatus posteriorally  Anes:  General  EBL:  Minimal  Description of Procedure  The patient was taken to OR # 1 and given general anesthesia.  After a prep with PCMX the patient was draped and a timeout performed.  Access to the abdomen was achieved with a 0 degree Optiview technique through the left upper quadrant.    Adhesions were not present.  Ports were placed to the the right of the midline including a 15 trocar in  the right upper quadrant placed obliquely.  The Satira Mccallum was used to retract the left lateral segment and the peritoneum was incised along the left crus.   [name].  A balloon test was negative but UGI showed a small hiatus hernia.  She denied GERD.    The pars flaccida technique was utilized to insert the blunt "finger " dissector from right to left behind the stomach.  This created a target zone to pass the band passer.     The lapband APS  Had been previously flushed and was inserted through the 15 trocar.  It was placed in the tip of "the finger"  and pulled around behind the stomach.   The band was plicated with 3 sutures of surgidec secured with Ty Knots.  The tubing was brought out through the lower incision on the right and connected to the port which had mesh sewn onto the back and was placed into the subcutaneous pocket.  The incisions were injected with Exparel and closed with 4-0 vicryl and  Dermabond.     The patient was taken to the PACU in stable condition.    [name], MD, [facility], [place] [phone]

## 2012-05-22 NOTE — Interval H&P Note (Signed)
History and Physical Interval Note:  05/22/2012 1:03 PM  Sandra Villa  has presented today for surgery, with the diagnosis of morbid obesity   The various methods of treatment have been discussed with the patient and family. After consideration of risks, benefits and other options for treatment, the patient has consented to  Procedure(s) (LRB) with comments: LAPAROSCOPIC GASTRIC BANDING (N/A) as a surgical intervention .  The patient's history has been reviewed, patient examined, no change in status, stable for surgery.  I have reviewed the patient's chart and labs.  Questions were answered to the patient's satisfaction.     Giovanni Bath B

## 2012-05-22 NOTE — Transfer of Care (Signed)
Immediate Anesthesia Transfer of Care Note  Patient: Sandra Villa  Procedure(s) Performed: Procedure(s) (LRB) with comments: LAPAROSCOPIC GASTRIC BANDING (N/A) INSERTION OF MESH ()  Patient Location: PACU  Anesthesia Type:General  Level of Consciousness: awake  Airway & Oxygen Therapy: Patient Spontanous Breathing and Patient connected to face mask oxygen  Post-op Assessment: Report given to PACU RN and Post -op Vital signs reviewed and stable  Post vital signs: Reviewed and stable  Complications: No apparent anesthesia complications

## 2012-05-22 NOTE — Anesthesia Preprocedure Evaluation (Addendum)
Anesthesia Evaluation  Patient identified by MRN, date of birth, ID band Patient awake    Reviewed: Allergy & Precautions, H&P , NPO status , Patient's Chart, lab work & pertinent test results  Airway Mallampati: II TM Distance: >3 FB Neck ROM: Full    Dental  (+) Dental Advisory Given and Teeth Intact   Pulmonary neg pulmonary ROS,  breath sounds clear to auscultation  Pulmonary exam normal       Cardiovascular hypertension, Pt. on medications - Past MI and - CHF Rhythm:Regular Rate:Normal     Neuro/Psych negative neurological ROS  negative psych ROS   GI/Hepatic negative GI ROS, Neg liver ROS,   Endo/Other  diabetes, Type 2, Oral Hypoglycemic AgentsMorbid obesity  Renal/GU negative Renal ROS     Musculoskeletal negative musculoskeletal ROS (+)   Abdominal (+) + obese,   Peds  Hematology negative hematology ROS (+)   Anesthesia Other Findings   Reproductive/Obstetrics                          Anesthesia Physical Anesthesia Plan  ASA: III  Anesthesia Plan: General   Post-op Pain Management:    Induction: Intravenous  Airway Management Planned: Oral ETT  Additional Equipment:   Intra-op Plan:   Post-operative Plan: Extubation in OR  Informed Consent: I have reviewed the patients History and Physical, chart, labs and discussed the procedure including the risks, benefits and alternatives for the proposed anesthesia with the patient or authorized representative who has indicated his/her understanding and acceptance.   Dental advisory given  Plan Discussed with: CRNA  Anesthesia Plan Comments:         Anesthesia Quick Evaluation

## 2012-05-22 NOTE — Anesthesia Procedure Notes (Signed)
Procedure Name: Intubation Date/Time: 05/22/2012 1:44 PM Performed by: Leroy Libman L Patient Re-evaluated:Patient Re-evaluated prior to inductionOxygen Delivery Method: Circle system utilized Preoxygenation: Pre-oxygenation with 100% oxygen Intubation Type: IV induction Ventilation: Mask ventilation without difficulty and Oral airway inserted - appropriate to patient size Laryngoscope Size: Hyacinth Meeker and 2 Grade View: Grade I Tube type: Oral Tube size: 7.5 mm Number of attempts: 1 Airway Equipment and Method: Stylet Placement Confirmation: ETT inserted through vocal cords under direct vision,  breath sounds checked- equal and bilateral and positive ETCO2 Secured at: 21 cm Tube secured with: Tape Dental Injury: Teeth and Oropharynx as per pre-operative assessment

## 2012-05-22 NOTE — Preoperative (Signed)
Beta Blockers   Reason not to administer Beta Blockers:Not Applicable, not on home BB 

## 2012-05-23 ENCOUNTER — Encounter (HOSPITAL_COMMUNITY): Payer: Self-pay | Admitting: Surgery

## 2012-05-23 ENCOUNTER — Observation Stay (HOSPITAL_COMMUNITY): Payer: BC Managed Care – PPO

## 2012-05-23 DIAGNOSIS — Z09 Encounter for follow-up examination after completed treatment for conditions other than malignant neoplasm: Secondary | ICD-10-CM

## 2012-05-23 LAB — GLUCOSE, CAPILLARY
Glucose-Capillary: 107 mg/dL — ABNORMAL HIGH (ref 70–99)
Glucose-Capillary: 99 mg/dL (ref 70–99)

## 2012-05-23 LAB — CBC WITH DIFFERENTIAL/PLATELET
Basophils Relative: 0 % (ref 0–1)
HCT: 38.9 % (ref 36.0–46.0)
Hemoglobin: 13.4 g/dL (ref 12.0–15.0)
Lymphocytes Relative: 16 % (ref 12–46)
Lymphs Abs: 1.2 10*3/uL (ref 0.7–4.0)
MCHC: 34.4 g/dL (ref 30.0–36.0)
Monocytes Absolute: 0.8 10*3/uL (ref 0.1–1.0)
Monocytes Relative: 10 % (ref 3–12)
Neutro Abs: 5.4 10*3/uL (ref 1.7–7.7)
RBC: 4.57 MIL/uL (ref 3.87–5.11)

## 2012-05-23 MED ORDER — IOHEXOL 300 MG/ML  SOLN: 20.0000 mL | Freq: Once | INTRAMUSCULAR | Status: DC | PRN

## 2012-05-23 MED ORDER — BIOTENE DRY MOUTH MT LIQD: 15.0000 mL | Freq: Two times a day (BID) | OROMUCOSAL | Status: DC

## 2012-05-23 MED ORDER — CHLORHEXIDINE GLUCONATE 0.12 % MT SOLN
15.0000 mL | Freq: Two times a day (BID) | OROMUCOSAL | Status: DC
  Administered 2012-05-23 (×2): 15 mL via OROMUCOSAL
  Filled 2012-05-23 (×4): qty 15

## 2012-05-23 MED ORDER — OXYCODONE-ACETAMINOPHEN 5-325 MG/5ML PO SOLN: 5.0000 mL | ORAL | Status: DC | PRN

## 2012-05-23 NOTE — Progress Notes (Signed)
*  Preliminary Results* Bilateral lower extremity venous duplex completed. Bilateral lower extremities are negative for deep vein thrombosis. No evidence of Baker's cyst bilaterally.  05/23/2012 10:17 AM Gertie Fey, RDMS, RDCS

## 2012-05-23 NOTE — Discharge Summary (Signed)
Physician Discharge Summary  Patient ID: Sandra Villa MRN: [id number] DOB/AGE: 55-Sep-1957 55 y.o.  Admit date: 05/22/2012 Discharge date: 05/23/2012  Admission Diagnoses:  obesity  Discharge Diagnoses:  same  Active Problems:  * No active hospital problems. *    Surgery:  Lapband APS  Discharged Condition: improved  Hospital Course:   Had surgery and kept overnight.  Ready for discharge  Consults: none  Significant Diagnostic Studies: UGI and DVT study    Discharge Exam: Blood pressure 128/83, pulse 89, temperature 98.5 F (36.9 C), temperature source Oral, resp. rate 18, height 5\' 6"  (1.676 m), weight 217 lb 6 oz (98.601 kg), SpO2 95.00%. Minimal pain.  Disposition: 01-Home or Self Care  Discharge Orders    Future Appointments: Provider: Department: Dept Phone: Center:   06/02/2012 10:30 AM Valarie Merino, MD [facility], [place] [phone] None   06/06/2012 4:00 PM [facility] Post-Op Class [facility] [phone] NDM     Future Orders Please Complete By Expires   Diet - low sodium heart healthy      Increase activity slowly      No wound care          Medication List     As of 05/23/2012  9:29 AM    TAKE these medications         calcium carbonate 600 MG Tabs   Commonly known as: OS-CAL   Take 600 mg by mouth 3 (three) times daily with meals.      fenofibrate 160 MG tablet   Take 160 mg by mouth daily before breakfast.      hydrochlorothiazide 25 MG tablet   Commonly known as: HYDRODIURIL   Take 1 tablet by mouth daily before breakfast.      L-Lysine 500 MG Tabs   Take by mouth 2 (two) times daily.      multivitamin Liqd   Take 15 mLs by mouth daily.      OCUVITE ADULT 50+ PO   Take by mouth 2 (two) times daily.      oxyCODONE-acetaminophen 5-325 MG/5ML solution   Commonly known as: ROXICET   Take 5-10 mLs by mouth every 4 (four) hours as needed.      pioglitazone-metformin 15-850 MG per  tablet   Commonly known as: ACTOPLUS MET   Take 1 tablet by mouth 2 (two) times daily with a meal.      pravastatin 40 MG tablet   Commonly known as: PRAVACHOL   Take 40 mg by mouth 2 (two) times daily.           Follow-up Information    Follow up with Valarie Merino, MD.   Contact information:   [address] [place] [address] [phone]          Signed: Valarie Merino 05/23/2012, 9:29 AM

## 2012-05-23 NOTE — Progress Notes (Signed)
Pt alert and oriented; VSS; denies any nausea or vomiting; tolerating water well; burping; denies flatus or BM; voiding without difficulty; ambulating in hallways without difficulty; using incentive spirometer as directed; c/o some abdominal soreness with relief from prn meds; husband at bedside; doppler study negative; awaiting UGI; pt already has follow up appts with [facility] and [facility]; aware of support group and BELT program; discharge instructions reviewed and pt and husband verbalized understanding of; questions answered. ADJUSTABLE GASTRIC BAND DISCHARGE INSTRUCTIONS  Drs. Fredrik Rigger, [place], [place], and [place] Call if you have any problems.   Call [phone] and ask for the surgeon on call.    If you need immediate assistance come to the ER at [facility]. Tell the ER personnel that you are a new post-op gastric banding patient. Signs and symptoms to report:   Severe vomiting or nausea. If you cannot tolerate clear liquids for longer than 1 day, you need to call your surgeon.    Abdominal pain which does not get better after taking your pain medication   Fever greater than 101 F degree   Difficulty breathing   Chest pain    Redness, swelling, drainage, or foul odor at incision sites    If your incisions open or pull apart   Swelling or pain in calf (lower leg)   Diarrhea, frequent watery, uncontrolled bowel movements.   Constipation, (no bowel movements for 3 days) if this occurs, Take Milk of Magnesia, 2 tablespoons by mouth, 3 times a day for 2 days if needed.  Call your doctor if constipation continues. Stop taking Milk of Magnesia once you have had a bowel movement. You may also use Miralax according to the label instructions.   Anything you consider "abnormal for you".   Normal side effects after Surgery:   Unable to sleep at night or concentrate   Irritability   Being tearful (crying) or depressed   These are common complaints, possibly related to your anesthesia, stress of  surgery and change in lifestyle, that usually go away a few weeks after surgery.  If these feelings continue, call your medical doctor.  Wound Care You may have surgical glue, steri-strips, or staples over your incisions after surgery.  Surgical glue:  Looks like a clear film over your incisions and will wear off gradually. Steri-strips: Strips of tape over your incisions. You may notice a yellowish color on the skin underneath the steri-strips. This is a substance used to make the steri-strips stick better. Do not pull the steri-strips off - let them fall off. Staples: Cherlynn Polo may be removed before you leave the hospital. If you go home with staples, call [facility] [phone]) for an appointment with your surgeon's nurse to have staples removed in 7 - 10 days. Showering: You may shower two days after your surgery unless otherwise instructed by your surgeon. Wash gently around wounds with warm soapy water, rinse well, and gently pat dry.  If you have a drain, you may need someone to hold this while you shower. Avoid tub baths until staples are removed and incisions are healed.    Medications   Medications should be liquid or crushed if larger than the size of a dime.  Extended release pills should not be crushed.   Depending on the size and number of medications you take, you may need to stagger/change the time you take your medications so that you do not over-fill your pouch.    Make sure you follow-up with your primary care physician  to make medication adjustments needed during rapid weight loss and life-style adjustment.   If you are diabetic, follow up with the doctor that prescribes your diabetes medication(s) within one week after surgery and check your blood sugar regularly.   Do not drive while taking narcotics!   Do not take acetaminophen (Tylenol) and Roxicet or Lortab Elixir at the same time since these pain medications contain acetaminophen.  Diet at home: (First 2  Weeks)  You will see the nutritionist two weeks after your surgery. She will advance your diet if you are tolerating liquids well. Once at home, if you have severe vomiting or nausea and cannot tolerate clear liquids lasting longer than 1 day, call your surgeon.  For Same Day Surgery Discharge Patients: The day of surgery drink water only: 2 ounces every 4 hours. If you are tolerating water, begin drinking your high protein shake the next morning. For Overnight Stay Patients: Begin high protein shake 2 ounces every 3 hours, 5 - 6 times per day.  Gradually increase the amount you drink as tolerated.  You may find it easier to slowly sip shakes throughout the day.  It is important to get your proteins in first.   Protein Shake   Drink at least 2 ounces of shake 5-6 times per day   Each serving of protein shakes should have a minimum of 15 grams of protein and no more than 5 grams of carbohydrate    Increase the amount of protein shake you drink as tolerated   Protein powder may be added to fluids such as non-fat milk or Lactaid milk (limit to 20 grams added protein powder per serving   The initial goal is to drink at least 8 ounces of protein shake/drink per day (or as directed by the nutritionist). Some examples of protein shakes are [employer], [employer], EAS Edge HP, and Unjury. Hydration   Gradually increase the amount of water and other liquids as tolerated (See Acceptable Fluids)   Gradually increase the amount of protein shake as tolerated     Sip fluids slowly and throughout the day   May use Sugar substitutes, use sparingly (limit to 6 - 8 packets per day).  Your fluid goal is 64 ounces of fluid daily. It may take a few weeks to build up to this.         32 oz (or more) should be clear liquids and 32 oz (or more) should be full liquids.         Liquids should not contain sugar, caffeine, or carbonation!  Acceptable Fluids Clear Liquids:   Water or Sugar-free flavored water,  Fruit H2O   Decaffeinated coffee or tea (sugar-free)   Crystal Lite, Wyler's Lite, Minute Maid Lite   Sugar-free Jell-O   Bouillon or broth   Sugar-free Popsicle:   *Less than 20 calories each; Limit 1 per day   Full Liquids:              Protein Shakes/Drinks + 2 choices per day of other full liquids shown below.    Other full liquids must be: No more than 12 grams of Carbs per serving,  No more than 3 grams of Fat per serving   Strained low-fat cream soup   Non-Fat milk   Fat-free Lactaid Milk   Sugar-free yogurt (Dannon Lite & Fit) Vitamins and Minerals (Start 1 day after surgery unless otherwise directed)   1 Chewable Multivitamin / Multimineral Supplement (i.e. Centrum for Adults)  Chewable Calcium Citrate with Vitamin D-3. Take 1500 mg each day.           (Example: 3 Chewable Calcium Plus 600 with Vitamin D-3 can be found at [facility])           Do not mix multivitamins containing iron with calcium supplements; take 2 hours   apart   Do not substitute Tums (calcium carbonate) for your calcium   Menstruating women and those at risk for anemia may need extra iron. Talk with your doctor to see if you need additional iron.     If you need extra iron:  Total daily Iron recommendations (including Vitamins) = 50 - 100 mg Iron/day Do not stop taking or change any vitamins or minerals until you talk to your nutritionist or surgeon. Your nutritionist and / or physician must approve all vitamin and mineral supplements. Exercise For maximum success, begin exercising as soon as your doctor recommends. Make sure your physician approves any physical activity.   Depending on fitness level, begin with a simple walking program   Walk 5-15 minutes each day, 7 days per week.    Slowly increase until you are walking 30-45 minutes per day   Consider joining our BELT program. [phone] or email belt@[employer] .edu Things to remember:   You may have sexual relations when you feel comfortable. It is VERY  important for female patients to use a reliable birth control method. Fertility often increases after surgery. Do not get pregnant for at least 18 months.   It is very important to keep all follow up appointments with your surgeon, nutritionist, primary care physician, and behavioral health practitioner. After the first year, please follow up with your bariatric surgeon at least once a year in order to maintain best weight loss results.  [facility]: [phone] [facility]: [phone]   Free counseling is available for you and your family through collaboration between [facility] and [place]. Please call [phone] and leave a message.    Consider purchasing a medical alert bracelet that says you had lap-band surgery.    [facility] has a free Bariatric Surgery Support Group that meets monthly, the 3rd Thursday, 6 pm, Classroom #1, [employer]. You may register online at [email or web address], but registration is not necessary. Select Classes and Support Groups, Bariatric Surgery, or Call [phone]   Do not return to work or drive until cleared by your surgeon   Use your CPAP when sleeping if applicable   Do not lift anything greater than ten pounds for at least two weeks.   You will probably have your first fill (fluid added to your band) 6 weeks after surgery  Joen Laura, RN Bariatric Nurse Coordinator

## 2012-05-23 NOTE — Care Management Note (Signed)
    Page 1 of 1   05/23/2012     11:56:23 AM   CARE MANAGEMENT NOTE 05/23/2012  Patient:  Sandra Villa, Sandra Villa   Account Number:  [id number]  Date Initiated:  05/23/2012  Documentation initiated by:  Lorenda Ishihara  Subjective/Objective Assessment:   56 yo female admitted s/p lap banding procedure.     Action/Plan:   Home when stable   Anticipated DC Date:  05/23/2012   Anticipated DC Plan:  HOME/SELF CARE      DC Planning Services  CM consult      Choice offered to / List presented to:             Status of service:  Completed, signed off Medicare Important Message given?   (If response is "NO", the following Medicare IM given date fields will be blank) Date Medicare IM given:   Date Additional Medicare IM given:    Discharge Disposition:  HOME/SELF CARE  Per UR Regulation:  Reviewed for med. necessity/level of care/duration of stay  If discussed at Long Length of Stay Meetings, dates discussed:    Comments:

## 2012-05-23 NOTE — Progress Notes (Signed)
Dr. Daphine Deutscher notified or UGI results; orders received to discharge pt to home; pt and RN aware of results and orders. Joen Laura, RN Bariatric Nurse Coordinator

## 2012-06-02 ENCOUNTER — Ambulatory Visit (INDEPENDENT_AMBULATORY_CARE_PROVIDER_SITE_OTHER): Payer: Managed Care, Other (non HMO) | Admitting: Surgery

## 2012-06-02 ENCOUNTER — Telehealth (INDEPENDENT_AMBULATORY_CARE_PROVIDER_SITE_OTHER): Payer: Self-pay

## 2012-06-02 ENCOUNTER — Encounter (INDEPENDENT_AMBULATORY_CARE_PROVIDER_SITE_OTHER): Payer: Self-pay | Admitting: Surgery

## 2012-06-02 VITALS — BP 132/80 | HR 96 | Temp 97.1°F | Resp 16 | Ht 65.5 in | Wt 212.8 lb

## 2012-06-02 DIAGNOSIS — Z9884 Bariatric surgery status: Secondary | ICD-10-CM

## 2012-06-02 NOTE — Telephone Encounter (Signed)
ERROR - did not call pt

## 2012-06-02 NOTE — Telephone Encounter (Signed)
LMOM letting the pt know that her 1st LBF appt with MM is scheduled for Thursday-Jan 30@1150am .

## 2012-06-02 NOTE — Patient Instructions (Signed)
Laparoscopic Gastric Band Surgery Care After These instructions give you information on caring for yourself after your procedure. Your doctor may also give you more specific instructions. Call your doctor if you have any problems or questions after your procedure. HOME CARE   Take walks throughout the day. Do not sit for longer than 1 hour while awake for 4 to 6 weeks.  You may shower 2 days after surgery. Pat the surgery cuts (incisions) dry. Do not rub the surgery cuts.  Do your coughing and deep breathing exercises.  Do not lift, push, or pull anything heavy until your doctor says it is okay.  Only take medicines as told by your doctor. Do not drive while taking pain medicine.  Drink plenty of fluids to keep your pee (urine) clear or pale yellow.  Stay on a clear liquid diet as long as your doctor tells you to.  Do not drink caffeine for 1 month.  Change bandages (dressings) as told by your doctor.  Check your surgery cuts for redness, pufffiness (swelling), abnormal coloring, fluid, or bleeding.  Follow your doctor's advice about vitamin and protein needs after surgery. GET HELP RIGHT AWAY IF:  You feel sick to your stomach (nauseous) and throw up (vomit).  You have pain and discomfort with swallowing.  You develop shortness of breath or difficulty breathing.  You have pain, puffiness, or feel warmth on your lower body.  You have very bad calf pain or pain not relieved by medicine.  You have a temperature by mouth above 102 F (38.9 C).  Your surgery cuts look red, puffy, or they leak fluid.  Your poop (stool) is black, tarry, or dark red.  You have chills.  You have chest pain.  You feel confused.  You have slurred speech.  You feel lightheaded when standing.  You suddenly feel weak.  You have any questions or concerns. MAKE SURE YOU:   Understand these instructions.  Will watch your condition.  Will get help right away if you are not doing well or  get worse. Document Released: 06/19/2010 Document Revised: 08/09/2011 Document Reviewed: 06/19/2010 [facility] Patient Information 2013 [place], [place].

## 2012-06-02 NOTE — Progress Notes (Signed)
Sandra Villa 57 y.o.  Body mass index is 34.87 kg/(m^2).  Patient Active Problem List  Diagnosis  . Type 2 DM for 10 years  . Hypercholesteremia  . Hypertension  . Chronic cough  . Obesity (BMI 30-39.9)    Allergies  Allergen Reactions  . Lisinopril     Cough   . Niacin And Related Rash    Past Surgical History  Procedure Date  . Breath tek h pylori 02/16/2012    Procedure: BREATH TEK H PYLORI;  Surgeon: Valarie Merino, MD;  Location: [facility];  Service: General;  Laterality: N/A;  . Tonsillectomy 57years old  . Laparoscopic gastric banding 05/22/2012    Procedure: LAPAROSCOPIC GASTRIC BANDING;  Surgeon: Valarie Merino, MD;  Location: [facility];  Service: General;  Laterality: N/A;  . Insertion of mesh 05/22/2012    Procedure: INSERTION OF MESH;  Surgeon: Valarie Merino, MD;  Location: [facility];  Service: General;;   [name], MD No diagnosis found.  Returns after her lapband on 23Dec.  She is doing well.  Incisions healing fine.   Due for first fill the second week in February.   [name], MD, [facility] [phone] beeper [phone]  06/02/2012 10:43 AM

## 2012-06-06 ENCOUNTER — Encounter: Payer: Managed Care, Other (non HMO) | Attending: Surgery | Admitting: *Deleted

## 2012-06-06 VITALS — Ht 65.5 in | Wt 211.3 lb

## 2012-06-06 DIAGNOSIS — Z713 Dietary counseling and surveillance: Secondary | ICD-10-CM | POA: Insufficient documentation

## 2012-06-06 DIAGNOSIS — E669 Obesity, unspecified: Secondary | ICD-10-CM

## 2012-06-06 DIAGNOSIS — Z01818 Encounter for other preprocedural examination: Secondary | ICD-10-CM | POA: Insufficient documentation

## 2012-06-06 NOTE — Patient Instructions (Addendum)
Patient to follow Phase 3A-Soft, High Protein Diet and follow-up at [facility] in 6 weeks for 2 months post-op nutrition visit for diet advancement. 

## 2012-06-07 ENCOUNTER — Encounter: Payer: Self-pay | Admitting: *Deleted

## 2012-06-07 NOTE — Progress Notes (Signed)
Bariatric Class:  Appt start time: 1600 end time:  1700.  2 Week Post-Operative Nutrition Class  Patient was seen on 06/07/11 for Post-[facility]ative [facility]   Surgery date: 05/22/12  Surgery type: LAGB  Start weight at [facility]: 249.4 lb (01/26/12)    Weight today: 211.3 lbs  Weight change: 38.1 lbs Total weight lost: 38.1 lbs  BMI: 34.6  The following the learning objectives were met by the patient during this course:  Identifies Phase 3A (Soft, High Proteins) Dietary Goals and will begin from 2 weeks post-operatively to 2 months post-operatively  Identifies appropriate sources of fluids and proteins   States protein recommendations and appropriate sources post-operatively  Identifies the need for appropriate texture modifications, mastication, and bite sizes when consuming solids  Identifies appropriate multivitamin and calcium sources post-operatively  Describes the need for physical activity post-operatively and will follow MD recommendations  States when to call healthcare provider regarding medication questions or post-operative complications  Handouts given during class include:  Phase 3A: Soft, High Protein Diet Handout  Band Fill Guidelines Handout  Follow-Up Plan: Patient will follow-up at [facility] in 6 weeks for 2 months post-op nutrition visit for diet advancement per MD.

## 2012-06-26 HISTORY — PX: OTHER SURGICAL HISTORY: SHX169

## 2012-06-29 ENCOUNTER — Encounter (INDEPENDENT_AMBULATORY_CARE_PROVIDER_SITE_OTHER): Payer: Self-pay | Admitting: Surgery

## 2012-06-29 ENCOUNTER — Ambulatory Visit (INDEPENDENT_AMBULATORY_CARE_PROVIDER_SITE_OTHER): Payer: Managed Care, Other (non HMO) | Admitting: Surgery

## 2012-06-29 VITALS — BP 110/72 | HR 72 | Temp 97.2°F | Resp 16 | Ht 66.0 in | Wt 202.0 lb

## 2012-06-29 DIAGNOSIS — Z9884 Bariatric surgery status: Secondary | ICD-10-CM

## 2012-06-29 NOTE — Patient Instructions (Signed)

## 2012-06-29 NOTE — Progress Notes (Signed)
Sandra Villa Body mass index is 32.60 kg/(m^2).  Having regurgitation:  no  Nocturnal reflux?  no  Amount of fill  1.5  This is her first fill.  Swallowed OK.  Instructions given.  Return 6 weeks

## 2012-07-17 ENCOUNTER — Encounter: Payer: Managed Care, Other (non HMO) | Attending: Surgery | Admitting: *Deleted

## 2012-07-17 ENCOUNTER — Encounter: Payer: Self-pay | Admitting: *Deleted

## 2012-07-17 VITALS — Ht 65.5 in | Wt 194.7 lb

## 2012-07-17 DIAGNOSIS — Z01818 Encounter for other preprocedural examination: Secondary | ICD-10-CM | POA: Insufficient documentation

## 2012-07-17 DIAGNOSIS — E669 Obesity, unspecified: Secondary | ICD-10-CM

## 2012-07-17 DIAGNOSIS — Z713 Dietary counseling and surveillance: Secondary | ICD-10-CM | POA: Insufficient documentation

## 2012-07-17 NOTE — Patient Instructions (Addendum)
Goals:  Follow Phase 3B: High Protein + Non-Starchy Vegetables  Eat 3-6 small meals/snacks, every 3-5 hrs  Increase lean protein foods to meet 60-80g goal  Increase fluid intake to 64oz +  Avoid drinking 15 minutes before, during and 30 minutes after eating  Aim for >30 min of physical activity daily  Switch to Calcium Citrate (1500 mg daily)

## 2012-07-17 NOTE — Progress Notes (Signed)
  Follow-up visit:  8 Weeks Post-Operative LABG Surgery  Medical Nutrition Therapy:  Appt start time: 0800 end time:  0830.  Primary concerns today: Post-operative Bariatric Surgery Nutrition Management.  Surgery date: 05/22/12  Surgery type: LAGB  Start weight at [facility]: 249.4 lb (01/26/12)    Weight today: 194.7 lbs  Weight change: 16.6 lbs Total weight lost: 54.7 lbs  BMI: 31.9 kg/m^2  Weight loss goal: 145 lbs % goal met: 52%  24-hr recall: B (AM): Protein shake (30g); 30 min later = cheese stick Snk (AM): NONE  L (PM): 2-3 oz fish, green beans Snk (PM): NONE  D (PM): 2-3 oz fish, veggie Snk (PM): SF jello OR 5 oz greek yogurt (12g)   Fluid intake: 3 (16.9 oz) water, 11 oz protein shake = ~ 64 oz Estimated total protein intake: 70-80g  Medications: No changes reported Supplementation: MVI (liquid), 1500 mg calcium carbonate - discussed changing to citrate  CBG monitoring: Daily FBG Average FBG per patient: 100-120 mg/dL Last patient reported W0J: 6.1% (Health screening at work)  Using straws: No Drinking while eating: No Hair loss: No Carbonated beverages: No N/V/D/C: Constipation; d/t low fiber intake Last Lap-Band fill: 06/29/12 - 1.5 cc; Feels she is in the green zone  Recent physical activity:  3 days/week @ 20 min on (tread climber at home)  Progress Towards Goal(s):  In progress.  Handouts given during visit include:  Phase 3B: High Protein + Non-Starchy Vegetables   Nutritional Diagnosis:  [place]-3.3 Overweight/obesity related to past poor dietary habits and physical inactivity as evidenced by patient w/ recent LAGB surgery following dietary guidelines for continued weight loss.    Intervention:  Nutrition education/diet advancement.  Monitoring/Evaluation:  Dietary intake, exercise, lap band fills, and body weight. Follow up in 1 months for 3 month post-op visit.

## 2012-07-31 ENCOUNTER — Encounter: Payer: Self-pay | Admitting: *Deleted

## 2012-07-31 NOTE — Progress Notes (Signed)
Sandra Villa stopped by for her baseline [place] scan @ 10 Weeks Post-Op   Surgery date: 05/22/12  Surgery type: LAGB  Start weight at [facility]: 249.4 lb (01/26/12)   Weight today: 192.0 lbs  Weight change: 2.7 lbs  Total weight lost: 57.4 lbs   Weight loss goal: 145 lbs  % goal met: 55%  TANITA  BODY COMP RESULTS  07/31/12   BMI (kg/m^2) 31.0   Fat Mass (lbs) 90.5   Fat Free Mass (lbs) 101.5   Total Body Water (lbs) 74.5

## 2012-08-08 ENCOUNTER — Telehealth (INDEPENDENT_AMBULATORY_CARE_PROVIDER_SITE_OTHER): Payer: Self-pay

## 2012-08-08 NOTE — Telephone Encounter (Signed)
LMOM for pt letting her know that I have scheduled her for a lap band fill on Mon, April 7 @ 4p.

## 2012-08-16 ENCOUNTER — Encounter: Payer: Managed Care, Other (non HMO) | Attending: Surgery | Admitting: *Deleted

## 2012-08-16 ENCOUNTER — Encounter: Payer: Self-pay | Admitting: *Deleted

## 2012-08-16 VITALS — Ht 65.5 in | Wt 190.0 lb

## 2012-08-16 DIAGNOSIS — Z713 Dietary counseling and surveillance: Secondary | ICD-10-CM | POA: Insufficient documentation

## 2012-08-16 DIAGNOSIS — Z01818 Encounter for other preprocedural examination: Secondary | ICD-10-CM | POA: Insufficient documentation

## 2012-08-16 DIAGNOSIS — E669 Obesity, unspecified: Secondary | ICD-10-CM

## 2012-08-16 NOTE — Progress Notes (Addendum)
Follow-up visit:  12 Weeks Post-Operative LAGB Surgery  Medical Nutrition Therapy:  Appt start time: 0830 end time:  0900.  Primary concerns today: Post-operative Bariatric Surgery Nutrition Management.  Surgery date: 05/22/12  Surgery type: LAGB  Start weight at [facility]: 249.4 lb (01/26/12)   Weight today: 190.0 lbs  Weight change: 2.0 lbs  Total weight lost: 59.4 lbs   Weight loss goal: 145 lbs  % goal met: 57%  TANITA  BODY COMP RESULTS  07/31/12 08/16/12   BMI (kg/m^2) 31.9 31.1   Fat Mass (lbs) 90.5 85.0   Fat Free Mass (lbs) 101.5 105.0   Total Body Water (lbs) 74.5 77.0   24-hr recall: B (6-7 AM): Premier protein shake (30g) Snk (10 AM): Cheese stick (6g) L (PM): Salad w/ cottage cheese and/or beans; OR vegetable and fish - 3 oz (25g)  Snk (PM): NONE  D (PM):  Salmon, Cod, or shrimp (3 oz) w/ vegetables (25g) Snk (PM): Cheese stick or Almonds (6g)  Fluid intake:  Protein shake (11 oz), water 50 oz, soup = 60-70 oz Estimated total protein intake: 80-90g  Medications: No changes reported. Appt Dr. Yehuda Budd soon Supplementation: Taking as directed  CBG monitoring: Daily FBG  Average FBG per patient: 95-100 mg  Average 2hrPP per patient: 120-130 mg  Lab Results  Component Value Date   HGBA1C 6.2* 05/22/2012   Using straws: No Drinking while eating: No Hair loss: No Carbonated beverages: No N/V/D/C: None reported Last Lap-Band fill: 06/29/12 - 1.5 cc; Feels she is in the green zone.   Recent physical activity:  Tread climber @ 3x/wk for 30 min; Started using Gazelle 1 day/week @ 30 min.   Progress Towards Goal(s):  In progress.  Nutritional Diagnosis:  [place]-3.3 Overweight/obesity related to past poor dietary habits and physical inactivity as evidenced by patient w/ recent LAGB surgery following dietary guidelines for continued weight loss.    Intervention:  Nutrition education.  Monitoring/Evaluation:  Dietary intake, exercise, lap band fills, and body weight.  Follow up in 3 months for 6 month post-op visit.

## 2012-08-16 NOTE — Patient Instructions (Addendum)
Goals:  Follow Phase 3B: High Protein + Non-Starchy Vegetables  Eat 3-6 small meals/snacks, every 3-5 hrs  Increase lean protein foods to meet 60-80g goal  Increase fluid intake to 64oz +  Add 15 grams of carbohydrate (fruit, whole grains) with meals  If having oatmeal, use steel cut oats  Avoid drinking 15 minutes before, during and 30 minutes after eating  Aim for >30 min of physical activity daily   Try PB2 (powdered peanut butter) - [employer], [employer], [employer], [employer] ([place], [place])

## 2012-09-04 ENCOUNTER — Ambulatory Visit (INDEPENDENT_AMBULATORY_CARE_PROVIDER_SITE_OTHER): Payer: Managed Care, Other (non HMO) | Admitting: Surgery

## 2012-09-04 ENCOUNTER — Encounter (INDEPENDENT_AMBULATORY_CARE_PROVIDER_SITE_OTHER): Payer: Self-pay | Admitting: Surgery

## 2012-09-04 VITALS — BP 112/68 | HR 68 | Temp 97.7°F | Resp 16 | Ht 66.0 in | Wt 182.8 lb

## 2012-09-04 DIAGNOSIS — Z9884 Bariatric surgery status: Secondary | ICD-10-CM

## 2012-09-04 NOTE — Patient Instructions (Signed)
Continue on the postop diet as per dietician.

## 2012-09-04 NOTE — Progress Notes (Signed)
Lapband Fill Encounter Problem List:   Patient Active Problem List  Diagnosis  . Type 2 DM for 10 years  . Hypercholesteremia  . Hypertension  . Chronic cough  . Obesity (BMI 30-39.9)  . Lapband APS + 1 suture closure of hiatus hernia Dec 2013    Sandra Villa Body mass index is 29.52 kg/(m^2). Weight loss since surgery  40 lbs.  Last Feb she was 258 at [facility] office.  Since first fill she is feeling satiety  Having regurgitation?:  no  Feel that they need a fill?  no  Nocturnal reflux?  no  Amount of fill  0   Port site: ok  Instructions given and weight loss goals discussed.  I think that she may be in green zone.  Will check back in 6 weeks  [name], MD, FACS

## 2012-10-20 ENCOUNTER — Ambulatory Visit (INDEPENDENT_AMBULATORY_CARE_PROVIDER_SITE_OTHER): Payer: Managed Care, Other (non HMO) | Admitting: Surgery

## 2012-10-20 ENCOUNTER — Encounter (INDEPENDENT_AMBULATORY_CARE_PROVIDER_SITE_OTHER): Payer: Self-pay | Admitting: Surgery

## 2012-10-20 VITALS — BP 140/80 | HR 78 | Temp 97.4°F | Resp 18 | Ht 65.5 in | Wt 172.8 lb

## 2012-10-20 DIAGNOSIS — Z9884 Bariatric surgery status: Secondary | ICD-10-CM

## 2012-10-20 NOTE — Progress Notes (Signed)
Sandra Villa 57 y.o.  Body mass index is 28.31 kg/(m^2).  Patient Active Problem List   Diagnosis Date Noted  . Lapband APS + 1 suture closure of hiatus hernia Dec 2013 06/02/2012  . Obesity (BMI 30-39.9) 05/19/2012  . Chronic cough 04/16/2012  . Type 2 DM for 10 years 01/13/2012  . Hypercholesteremia 01/13/2012  . Hypertension 01/13/2012    Allergies  Allergen Reactions  . Lisinopril     Cough   . Niacin And Related Rash    Past Surgical History  Procedure Laterality Date  . Breath tek h pylori  02/16/2012    Procedure: BREATH TEK H PYLORI;  Surgeon: Valarie Merino, MD;  Location: [facility];  Service: General;  Laterality: N/A;  . Tonsillectomy  57years old  . Laparoscopic gastric banding  05/22/2012    Procedure: LAPAROSCOPIC GASTRIC BANDING;  Surgeon: Valarie Merino, MD;  Location: [facility];  Service: General;  Laterality: N/A;  . Insertion of mesh  05/22/2012    Procedure: INSERTION OF MESH;  Surgeon: Valarie Merino, MD;  Location: [facility];  Service: General;;   [name], MD No diagnosis found.  Doing well with having only one fill.  [name] She has lost 50 pounds. I think she is doing great and is still in the green zone. Will see her back in 6 weeks to consider a second fill if she plateaus and her weight loss [name], MD, [facility] [phone] beeper [phone]  10/20/2012 5:01 PM

## 2012-10-20 NOTE — Patient Instructions (Signed)
Thanks for your patience.  If you need further assistance after leaving the office, please call our office and speak with a [facility] nurse.  [phone].  If you want to leave a message for Dr. Macklin Jacquin, please call his office phone at [phone]. 

## 2012-11-07 ENCOUNTER — Encounter: Payer: Managed Care, Other (non HMO) | Attending: Surgery | Admitting: *Deleted

## 2012-11-07 ENCOUNTER — Encounter: Payer: Self-pay | Admitting: *Deleted

## 2012-11-07 VITALS — Ht 65.5 in | Wt 168.5 lb

## 2012-11-07 DIAGNOSIS — Z01818 Encounter for other preprocedural examination: Secondary | ICD-10-CM | POA: Insufficient documentation

## 2012-11-07 DIAGNOSIS — Z713 Dietary counseling and surveillance: Secondary | ICD-10-CM | POA: Insufficient documentation

## 2012-11-07 DIAGNOSIS — E669 Obesity, unspecified: Secondary | ICD-10-CM

## 2012-11-07 NOTE — Patient Instructions (Addendum)
Goals:  Follow Phase 3B: High Protein + Non-Starchy Vegetables  Add 15 grams of carbohydrate (fruit, whole grains, starchy vegetables) with meals  Avoid drinking 15 minutes before, during and 30 minutes after eating  Aim for >30 min of physical activity daily  Have a snack of 15 g carbs and a protein source before working out   Try PB2 (powdered peanut butter) - [employer], [employer], [employer], [employer], [employer] ([place], [place]) Limit fat to 30g daily - if having avocado, no nuts same day.  TANITA  BODY COMP RESULTS  07/31/12 08/16/12 11/07/12   BMI (kg/m^2) 31.9 31.1 27.6   Fat Mass (lbs) 90.5 85.0 68.0   Fat Free Mass (lbs) 101.5 105.0 100.5   Total Body Water (lbs) 74.5 77.0 73.5

## 2012-11-07 NOTE — Progress Notes (Addendum)
Follow-up visit:  6 Months Post-Operative LAGB Surgery  Medical Nutrition Therapy:  Appt start time: 0800 end time:  0830.  Primary concerns today: Post-operative Bariatric Surgery Nutrition Management. Using MFP since Saturday, which shows good food choices and portions WNL. May not be eating enough CHOs. No fill needed.   Surgery date: 05/22/12  Surgery type: LAGB  Start weight at [facility]: 249.4 lb (01/26/12)  Highest pt reported pre-op wt: 259 lbs (@PCP )  Weight today: 168.5 lbs  Weight change: 21.5 lbs  Total weight lost: 80.9 lbs   Weight loss goal: 145 lbs  % goal met: 77%  TANITA  BODY COMP RESULTS  07/31/12 08/16/12 11/07/12   BMI (kg/m^2) 31.9 31.1 27.6   Fat Mass (lbs) 90.5 85.0 68.0   Fat Free Mass (lbs) 101.5 105.0 100.5   Total Body Water (lbs) 74.5 77.0 73.5   Fluid intake:  Premier Protein shake (11 oz), water 50 oz = 60-70 oz Estimated total protein intake: 65-90g - does not eat meat; eats fish, eggs, nuts  Medications:  HCTZ, Metformin, Pravastatin decreased. Off TG meds Supplementation: Taking as directed  CBG monitoring: Daily FBG  Average FBG per patient: 100 mg  Average 2hrPP per patient: ~ 90 mg Last pt reported A1c: 5.7% @ PCP (10/13/12)  Lab Results  Component Value Date   HGBA1C 6.2* 05/22/2012   Using straws: No Drinking while eating: No Hair loss: No Carbonated beverages: No N/V/D/C: None reported Last Lap-Band fill: 06/29/12 - 1.5 cc; Continues to feel she is in the green zone, though states her wt loss has been "flat" the last few weeks  Recent physical activity:  Tread climber @ 3x/wk for 30 min on level 3.  States she is burning 300 calories doing this.  Progress Towards Goal(s):  In progress.  Nutritional Diagnosis:  [place]-3.3 Overweight/obesity related to past poor dietary habits and physical inactivity as evidenced by patient w/ recent LAGB surgery following dietary guidelines for continued weight loss.    Intervention:  Nutrition  education.  Monitoring/Evaluation:  Dietary intake, exercise, lap band fills, and body weight. Follow up in 3 months for 9 month post-op visit.

## 2012-11-30 ENCOUNTER — Ambulatory Visit (INDEPENDENT_AMBULATORY_CARE_PROVIDER_SITE_OTHER): Payer: Managed Care, Other (non HMO) | Admitting: Surgery

## 2012-11-30 ENCOUNTER — Encounter (INDEPENDENT_AMBULATORY_CARE_PROVIDER_SITE_OTHER): Payer: Self-pay | Admitting: Surgery

## 2012-11-30 VITALS — BP 130/78 | HR 66 | Resp 14 | Ht 66.0 in | Wt 163.6 lb

## 2012-11-30 DIAGNOSIS — Z9884 Bariatric surgery status: Secondary | ICD-10-CM

## 2012-11-30 NOTE — Progress Notes (Signed)
Lapband Fill Encounter Problem List:   Patient Active Problem List   Diagnosis Date Noted  . Lapband APS + 1 suture closure of hiatus hernia Dec 2013 06/02/2012  . Obesity (BMI 30-39.9) 05/19/2012  . Chronic cough 04/16/2012  . Type 2 DM for 10 years 01/13/2012  . Hypercholesteremia 01/13/2012  . Hypertension 01/13/2012    Denyse Amass Body mass index is 26.42 kg/(m^2). Weight loss since surgery  58 lbs  Having regurgitation?:  no  Feel that they need a fill?  Feeling less restriction.  No prior fill  Nocturnal reflux?  no  Amount of fill  0.5     Instructions given and weight loss goals discussed.    Doing well with good weight loss thus far.  See back in 6 weeks.   [name], MD, FACS

## 2012-11-30 NOTE — Patient Instructions (Signed)

## 2012-12-22 ENCOUNTER — Encounter: Payer: Self-pay | Admitting: *Deleted

## 2012-12-25 ENCOUNTER — Encounter: Payer: Self-pay | Admitting: Internal Medicine

## 2012-12-25 ENCOUNTER — Ambulatory Visit (INDEPENDENT_AMBULATORY_CARE_PROVIDER_SITE_OTHER): Payer: Managed Care, Other (non HMO) | Admitting: Internal Medicine

## 2012-12-25 VITALS — BP 126/80 | HR 78 | Ht 65.5 in | Wt 161.4 lb

## 2012-12-25 DIAGNOSIS — R05 Cough: Secondary | ICD-10-CM

## 2012-12-25 DIAGNOSIS — I10 Essential (primary) hypertension: Secondary | ICD-10-CM

## 2012-12-25 DIAGNOSIS — R0609 Other forms of dyspnea: Secondary | ICD-10-CM

## 2012-12-25 DIAGNOSIS — R053 Chronic cough: Secondary | ICD-10-CM

## 2012-12-25 DIAGNOSIS — E139 Other specified diabetes mellitus without complications: Secondary | ICD-10-CM

## 2012-12-25 DIAGNOSIS — E7800 Pure hypercholesterolemia, unspecified: Secondary | ICD-10-CM

## 2012-12-25 DIAGNOSIS — E119 Type 2 diabetes mellitus without complications: Secondary | ICD-10-CM

## 2012-12-25 DIAGNOSIS — R0600 Dyspnea, unspecified: Secondary | ICD-10-CM

## 2012-12-25 DIAGNOSIS — R059 Cough, unspecified: Secondary | ICD-10-CM

## 2012-12-25 DIAGNOSIS — Z9884 Bariatric surgery status: Secondary | ICD-10-CM

## 2012-12-25 DIAGNOSIS — E669 Obesity, unspecified: Secondary | ICD-10-CM

## 2012-12-25 NOTE — Progress Notes (Signed)
OFFICE NOTE  Chief Complaint:  Followup office visit  Primary Care Physician: Sandra Grays, MD  HPI:  Sandra Villa is a 57 year old female with a history of coronary artery disease in the family. Also hypertension, morbid obesity, dyslipidemia and diabetes type 2. She has had recent significant weight loss due to gastric banding and also changes of course in diet and increased exercise. In fact, today her weight is 202, down from 213. Previously she weighed over 260. She was having some shortness of breath. I recommended metabolic testing. She did do that on June 26, 2012. Unfortunately there was a submaximal effort with an RER of 0.88 (greater than 1.09 is normal). The peak VO2, however, was actually fairly high for a low amount of effort at 94% predicted. This is possibly because of a high heart rate at 96% predicted. The heart rate and VO2 curves demonstrate an ischemic response with an anaerobic threshold at 10 minutes. There was slight flattening of the curve, however, given the high VO2 this is unlikely to be macrovascular disease. I suspect she does have some microvascular ischemia and was noted to be tachycardic at baseline with a heart rate over 100. Part of this may be due to weight and deconditioning, as well as small vessel ischemia. I discussed possible ways we can treat this, including the addition of a beta blocker or increased exercise. She would prefer to increase her exercise, and I have given her an exercise prescription and we will plan to see her back in 6 months. If there has been continued weight loss and improvement with her exercise capacity we may consider repeating her MET test at that time.  She returns today with a marked improvement in weight loss. She is down 40 pounds since her last office visit and had lost previously about 60 pounds. This is due to marked changes in her lifestyle and she is no longer taking blood pressure medication. She is also close to probably  coming off of her diabetes medication. She really looks great and actually feels much better. She denies any worsening shortness of breath or symptoms that she was previously having.  PMHx:  Past Medical History  Diagnosis Date  . Hypertension   . Lump in female breast   . Wears glasses     for reading  . Morbid obesity   . Hyperlipidemia     Pt reported on 01/26/12  . Diabetes mellitus     type 2  . Family history of coronary artery disease     Past Surgical History  Procedure Laterality Date  . Breath tek h pylori  02/16/2012    Procedure: BREATH TEK H PYLORI;  Surgeon: Sandra Merino, MD;  Location: [facility];  Service: General;  Laterality: N/A;  . Tonsillectomy  57years old  . Laparoscopic gastric banding  05/22/2012    Procedure: LAPAROSCOPIC GASTRIC BANDING;  Surgeon: Sandra Merino, MD;  Location: [facility];  Service: General;  Laterality: N/A;  . Insertion of mesh  05/22/2012    Procedure: INSERTION OF MESH;  Surgeon: Sandra Merino, MD;  Location: [facility];  Service: General;;  . Cpet  06/26/2012    FAMHx:  Family History  Problem Relation Age of Onset  . Diabetes Mother   . Heart disease Mother     congestive heart failure  . COPD Mother   . Stroke Father   . Hyperlipidemia Brother     SOCHx:   reports that she has never smoked. She  has never used smokeless tobacco. She reports that  drinks alcohol. She reports that she does not use illicit drugs.  ALLERGIES:  Allergies  Allergen Reactions  . Lisinopril     Cough   . Niacin And Related Rash    ROS: A comprehensive review of systems was negative.  HOME MEDS: Current Outpatient Prescriptions  Medication Sig Dispense Refill  . ACCU-CHEK AVIVA PLUS test strip       . calcium carbonate (OS-CAL) 600 MG TABS Take 600 mg by mouth 3 (three) times daily with meals.      . metFORMIN (GLUCOPHAGE) 850 MG tablet Take 1,000 mg by mouth daily.       . Multiple Vitamin (MULTIVITAMIN) capsule Take 1 capsule by  mouth daily.      . Vitamin B1-B12 5.5-0.0125 MG/5ML SOLN Take 1 drop by mouth daily.       No current facility-administered medications for this visit.    LABS/IMAGING: No results found for this or any previous visit (from the past 48 hour(s)). No results found.  VITALS: BP 126/80  Pulse 78  Ht 5' 5.5" (1.664 m)  Wt 161 lb 6.4 oz (73.211 kg)  BMI 26.44 kg/m2  EXAM: General appearance: alert and no distress Neck: no adenopathy, no carotid bruit, no JVD, supple, symmetrical, trachea midline and thyroid not enlarged, symmetric, no tenderness/mass/nodules Lungs: clear to auscultation bilaterally Heart: regular rate and rhythm, S1, S2 normal, no murmur, click, rub or gallop Abdomen: soft, non-tender; bowel sounds normal; no masses,  no organomegaly Extremities: extremities normal, atraumatic, no cyanosis or edema Pulses: 2+ and symmetric Skin: Skin color, texture, turgor normal. No rashes or lesions Neurologic: Grossly normal  EKG: Normal sinus rhythm at 78  ASSESSMENT: 1. Dyspnea on exertion 2. Morbid obesity-now mildly overweight status post lap band and lifestyle changes 3. Hypertension-resolved, off of medications 4. Diabetes type 2-A1c now 5.6, probably can come off of metformin  PLAN: 1.   I am thrilled that Sandra Villa has had significant weight loss. Her entire life essentially turned around with much improvement in her shortness of breath and better energy levels. She no longer needs blood pressure medication I feel can come off of her diabetic medications fairly soon. She is doing very well and should continue to work on risk factor modification. We can see her back as needed in the future.  Sandra Nose, MD, [facility] Attending Cardiologist [facility]  HILTY,Sandra Villa 12/25/2012, 10:33 AM

## 2012-12-25 NOTE — Patient Instructions (Addendum)
Follow up as needed

## 2013-01-16 ENCOUNTER — Encounter (INDEPENDENT_AMBULATORY_CARE_PROVIDER_SITE_OTHER): Payer: Managed Care, Other (non HMO) | Admitting: Surgery

## 2013-01-25 ENCOUNTER — Ambulatory Visit (INDEPENDENT_AMBULATORY_CARE_PROVIDER_SITE_OTHER): Payer: Managed Care, Other (non HMO) | Admitting: Physician Assistant

## 2013-01-25 ENCOUNTER — Encounter (INDEPENDENT_AMBULATORY_CARE_PROVIDER_SITE_OTHER): Payer: Self-pay

## 2013-01-25 VITALS — BP 126/80 | HR 76 | Temp 98.3°F | Resp 16 | Ht 66.0 in | Wt 159.2 lb

## 2013-01-25 DIAGNOSIS — Z4651 Encounter for fitting and adjustment of gastric lap band: Secondary | ICD-10-CM

## 2013-01-25 NOTE — Patient Instructions (Signed)

## 2013-01-25 NOTE — Progress Notes (Signed)
  HISTORY: Sandra Villa is a 57 y.o.female who received an AP-Standard lap-band in January 2013 by Dr. Daphine Deutscher. She has lost 4 lbs since her last visit in July. She reports a bit of an increase in hunger and portion sizes since then, but no regurgitation, reflux or night cough. She would like a fill today. She has noticed a bit of a plateau since increasing her exercise to five days a week but is noticing her clothing fitting more loosely. She would like a fill today.  VITAL SIGNS: Filed Vitals:   01/25/13 1143  BP: 126/80  Pulse: 76  Temp: 98.3 F (36.8 C)  Resp: 16    PHYSICAL EXAM: Physical exam reveals a very well-appearing 57 y.o.female in no apparent distress Neurologic: Awake, alert, oriented Psych: Bright affect, conversant Respiratory: Breathing even and unlabored. No stridor or wheezing Abdomen: Soft, nontender, nondistended to palpation. Incisions well-healed. No incisional hernias. Port easily palpated. Extremities: Atraumatic, good range of motion.  ASSESMENT: 57 y.o.  female  s/p AP-Standard lap-band.   PLAN: The patient's port was accessed with a 20G Huber needle without difficulty. Clear fluid was aspirated and 0.5 mL saline was added to the port. The patient was able to swallow water without difficulty following the procedure and was instructed to take clear liquids for the next 24-48 hours and advance slowly as tolerated.

## 2013-02-08 ENCOUNTER — Ambulatory Visit: Payer: BC Managed Care – PPO | Admitting: *Deleted

## 2013-02-19 ENCOUNTER — Encounter: Payer: Self-pay | Admitting: Neurology

## 2013-02-20 ENCOUNTER — Ambulatory Visit (INDEPENDENT_AMBULATORY_CARE_PROVIDER_SITE_OTHER): Payer: Managed Care, Other (non HMO) | Admitting: Neurology

## 2013-02-20 ENCOUNTER — Encounter: Payer: Self-pay | Admitting: Neurology

## 2013-02-20 VITALS — BP 154/84 | HR 70 | Ht 64.0 in | Wt 160.0 lb

## 2013-02-20 DIAGNOSIS — M533 Sacrococcygeal disorders, not elsewhere classified: Secondary | ICD-10-CM

## 2013-02-20 DIAGNOSIS — M545 Low back pain, unspecified: Secondary | ICD-10-CM | POA: Insufficient documentation

## 2013-02-20 DIAGNOSIS — G609 Hereditary and idiopathic neuropathy, unspecified: Secondary | ICD-10-CM

## 2013-02-20 DIAGNOSIS — M21372 Foot drop, left foot: Secondary | ICD-10-CM

## 2013-02-20 DIAGNOSIS — M216X9 Other acquired deformities of unspecified foot: Secondary | ICD-10-CM

## 2013-02-20 NOTE — Progress Notes (Signed)
[facility]  Provider:  Dr Hosie Poisson Referring Provider: [name], MD Primary Care Physician:  [name], MD  CC:  Left foot drop  HPI:  Sandra Villa is a 57 y.o. female here as a referral from Dr. Collins Scotland for evaluation of left foot drop  1 month ago noticed left foot dragging and started tripping over it. No she was unable to flex up her left foot. No trauma to her back knees or ankle during that time. Also noted she was numb on the top of her left foot. Around 2 weeks after noted right lower back pain, described as a sharp stabbing pain, nonradiating, has been constant.. Noted unable to flex up left foot. No trauma to back or knees around that time. Does cross her legs a lot. Has recently lost a lot of weight, had lab band surgery, has lost around 100 pounds in the past year. Does notice some occasional paresthesias in pads of feet L>R.   Currently has DM, reports it is well controlled. HbA1c at 5.4.   Review of Systems: Out of a complete 14 system review, the patient complains of only the following symptoms, and all other reviewed systems are negative. For weight loss and back pain  History   Social History  . Marital Status: Married    Spouse Name: Sandra Villa    Number of Children: 2  . Years of Education: BS   Occupational History  . DISTRICT  MANAGER [employer]   Social History Main Topics  . Smoking status: Never Smoker   . Smokeless tobacco: Never Used  . Alcohol Use: 0.0 oz/week     Comment: rarely - maybe once per month if that  . Drug Use: No  . Sexual Activity: Not on file   Other Topics Concern  . Not on file   Social History Narrative   Patient lives at home with her husband Sandra Villa). 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   Patient has college education. (B.S)   Right handed.   Caffeine- None    Family History  Problem Relation Age of Onset  . Diabetes Mother   . Heart disease Mother     congestive heart failure  . COPD  Mother   . Stroke Father   . Hyperlipidemia Brother     Past Medical History  Diagnosis Date  . Hypertension   . Lump in female breast   . Wears glasses     for reading  . Morbid obesity   . Hyperlipidemia     Pt reported on 01/26/12  . Diabetes mellitus     type 2  . Family history of coronary artery disease     Past Surgical History  Procedure Laterality Date  . Breath tek h pylori  02/16/2012    Procedure: BREATH TEK H PYLORI;  Surgeon: Valarie Merino, MD;  Location: [facility];  Service: General;  Laterality: N/A;  . Tonsillectomy  57years old  . Laparoscopic gastric banding  05/22/2012    Procedure: LAPAROSCOPIC GASTRIC BANDING;  Surgeon: Valarie Merino, MD;  Location: [facility];  Service: General;  Laterality: N/A;  . Insertion of mesh  05/22/2012    Procedure: INSERTION OF MESH;  Surgeon: Valarie Merino, MD;  Location: [facility];  Service: General;;  . Cpet  06/26/2012    Current Outpatient Prescriptions  Medication Sig Dispense Refill  . ACCU-CHEK AVIVA PLUS test strip       . calcium carbonate (OS-CAL) 600 MG TABS  Take 600 mg by mouth 3 (three) times daily with meals.      Marland Kitchen HYDROcodone-acetaminophen (NORCO/VICODIN) 5-325 MG per tablet Take 1 tablet by mouth every 6 (six) hours as needed for pain.      . metFORMIN (GLUCOPHAGE) 850 MG tablet Take 500 mg by mouth daily.       . Multiple Vitamin (MULTIVITAMIN) capsule Take 1 capsule by mouth daily.      . pravastatin (PRAVACHOL) 20 MG tablet Take 20 mg by mouth daily.      . Vitamin B1-B12 5.5-0.0125 MG/5ML SOLN Take 1 drop by mouth daily.       No current facility-administered medications for this visit.    Allergies as of 02/20/2013 - Review Complete 02/20/2013  Allergen Reaction Noted  . Lisinopril  04/03/2012  . Niacin and related Rash 04/03/2012    Vitals: BP 154/84  Pulse 70  Ht 5\' 4"  (1.626 m)  Wt 160 lb (72.576 kg)  BMI 27.45 kg/m2 Last Weight:  Wt Readings from Last 1 Encounters:  02/20/13 160 lb  (72.576 kg)   Last Height:   Ht Readings from Last 1 Encounters:  02/20/13 5\' 4"  (1.626 m)     Physical exam: Exam: Gen: NAD, conversant Eyes: anicteric sclerae, moist conjunctivae HENT: Atraumati Neck: Trachea midline; supple,  Lungs: CTA, no wheezing, rales, rhonic                          CV: RRR, no MRG Abdomen: Soft, non-tender;  Extremities: No peripheral edema  Skin: Normal temperature, no rash,  Psych: Appropriate affect, pleasant  Neuro: MS: AA&Ox3, appropriately interactive, normal affect   Speech: fluent w/o paraphasic error  Memory: good recent and remote recall  CN: PERRL, EOMI no nystagmus, no ptosis, sensation intact to LT V1-V3 bilat, face symmetric, no weakness, hearing grossly intact, palate elevates symmetrically, shoulder shrug 5/5 bilat,  tongue protrudes midline, no fasiculations noted.  Motor: normal bulk and tone Strength: 5/5  In all extremities except for 4-/5 dorsiflexion L foot  Coord: rapid alternating and point-to-point (FNF, HTS) movements intact.  Reflexes: symmetrical, bilat downgoing toes  Sens: Decreased light touch, temperature in anterior surface of foot on the left in the L5 dermatomal distribution  Gait: posture, stance, stride and arm-swing normal. Difficulty with heel/toe walking.    Assessment:  After physical and neurologic examination, review of laboratory studies, imaging, neurophysiology testing and pre-existing records, assessment will be reviewed on the problem list.  Plan:  Treatment plan and additional workup will be reviewed under Problem List.  Ms Standish is a pleasant 57 year old woman who presents for initial evaluation of new-onset left foot drop, associate with some right lumbar back pain and sensory loss on the anterior surface of the left foot. She has a history pertinent for 100 pound weight loss in the past year do to gastric banding surgery. Her physical exam is pertinent for a left foot drop and decreased  sensation in the anterior surface of the left foot in the L5 dermatomal distribution. With her history of drastic weight loss would have concern for possible compression of the peroneal nerve. With back pain would also consider a radiculopathy. Will order an EMG nerve conduction study, place referral for physical therapy for evaluation for possible ankle-foot orthotic. Patient will followup after procedures.  1)L foot drop 2)neuropathy 3)back pain  -EMG/NCS -PT referral for AFO evaluation and treatment of low back pain -consider MRI lumbar spine in the future -  follow up 3 to 4 months

## 2013-02-20 NOTE — Patient Instructions (Signed)
Overall you are doing fairly well but I do want to suggest a few things today:   As far as diagnostic testing: I would like to get a EMG/NCS to check for compression of the nerve  I will place a referral for physical therapy. You may benefit from an Ankle Foot Orthotic.  I would like to see you back in 3 to 4 months, sooner if we need to. Please call us with any interim questions, concerns, problems, updates or refill requests.   Please also call us for any test results so we can go over those with you on the phone.  My clinical assistant and will answer any of your questions and relay your messages to me and also relay most of my messages to you.   Our phone number is [phone]. We also have an after hours call service for urgent matters and there is a physician on-call for urgent questions. For any emergencies you know to call 911 or go to the nearest emergency room

## 2013-02-21 ENCOUNTER — Encounter: Payer: Self-pay | Admitting: *Deleted

## 2013-02-21 ENCOUNTER — Encounter: Payer: Managed Care, Other (non HMO) | Attending: Surgery | Admitting: *Deleted

## 2013-02-21 VITALS — Ht 65.5 in | Wt 161.0 lb

## 2013-02-21 DIAGNOSIS — Z713 Dietary counseling and surveillance: Secondary | ICD-10-CM | POA: Insufficient documentation

## 2013-02-21 DIAGNOSIS — E669 Obesity, unspecified: Secondary | ICD-10-CM | POA: Insufficient documentation

## 2013-02-21 NOTE — Progress Notes (Signed)
Follow-up visit:  9 Months Post-Operative LAGB Surgery  Medical Nutrition Therapy:  Appt start time: 0900 end time:  0930.  Primary concerns today: Post-operative Bariatric Surgery Nutrition Management. Had one fill between June-Aug of 0.5 cc. Feels in the green zone. Diagnosed with left foot drop and has EKG scheduled with neurology. Has not been able to exercise (per MD) for last week. Inquires about the best way to add fiber. Discussed adding metamucil powder to morning shake.   Surgery date: 05/22/12  Surgery type: LAGB  Start weight at [facility]: 249.4 lb (01/26/12)  Highest pt reported pre-op wt: 259 lbs (@PCP )  Weight today: 161.0 lbs  Weight change: - 7.5 lbs  Total weight lost: 88.4 lbs (98.0 lbs from PCP wt)  Weight loss goal: 145 lbs  % goal met: 85%  TANITA  BODY COMP RESULTS  07/31/12 08/16/12 11/07/12 02/21/13   BMI (kg/m^2) 31.9 31.1 27.6 26.4   Fat Mass (lbs) 90.5 85.0 68.0 58.0   Fat Free Mass (lbs) 101.5 105.0 100.5 103.0   Total Body Water (lbs) 74.5 77.0 73.5 75.5   Fluid intake:  Premier Protein shake (11 oz), water 50 oz = 60-70 oz Estimated total protein intake: 65-90g - does not eat meat; eats fish, eggs, nuts  Medications:  No changes Supplementation: Taking as directed  CBG monitoring: Daily FBG  Average FBG per patient: 120 mg  Average 2hrPP per patient: ~ 98-101 mg Last pt reported A1c: 5.4% @ PCP (8/14)  Lab Results  Component Value Date   HGBA1C 6.2* 05/22/2012   Using straws: No Drinking while eating: No Hair loss: No Carbonated beverages: No N/V/D/C:  Takes dulcolax for chronic constipation. Inquires about the best way to get more fiber Last Lap-Band fill: 01/25/13 - 0.5 cc; Feels she is in the green zone, though states her wt loss continues to be "slow"  Recent physical activity:  Tread climber @ 5x/wk for 35-45 min on level 3. 1 day/wk, walks 60 min at level 1.   Progress Towards Goal(s):  In progress.  Nutritional Diagnosis:  [place]-3.3  Overweight/obesity related to past poor dietary habits and physical inactivity as evidenced by patient w/ recent LAGB surgery following dietary guidelines for continued weight loss.    Intervention:  Nutrition education.  Monitoring/Evaluation:  Dietary intake, exercise, lap band fills, and body weight. Follow up in 6 months for 15 month post-op visit.

## 2013-02-21 NOTE — Patient Instructions (Addendum)
Goals:  Follow Phase 3B: High Protein + Non-Starchy Vegetables  Add 15 grams of carbohydrate (fruit, whole grains, starchy vegetables) with meals  Avoid drinking 15 minutes before, during and 30 minutes after eating  Aim for >30 min of physical activity daily  Have a snack of 15 g carbs and a protein source before working out   Limit fat to 30g daily - if having avocado, no nuts same day. NEW:  Try Metamucil powder in your protein shake - add 3-5 grams at a time  TANITA  BODY COMP RESULTS  07/31/12 08/16/12 11/07/12 02/21/13   BMI (kg/m^2) 31.9 31.1 27.6 26.4   Fat Mass (lbs) 90.5 85.0 68.0 58.0   Fat Free Mass (lbs) 101.5 105.0 100.5 103.0   Total Body Water (lbs) 74.5 77.0 73.5 75.5

## 2013-02-22 ENCOUNTER — Encounter (INDEPENDENT_AMBULATORY_CARE_PROVIDER_SITE_OTHER): Payer: Self-pay | Admitting: Radiology

## 2013-02-22 ENCOUNTER — Ambulatory Visit (INDEPENDENT_AMBULATORY_CARE_PROVIDER_SITE_OTHER): Payer: Managed Care, Other (non HMO) | Admitting: Neurology

## 2013-02-22 DIAGNOSIS — M21372 Foot drop, left foot: Secondary | ICD-10-CM

## 2013-02-22 DIAGNOSIS — G5732 Lesion of lateral popliteal nerve, left lower limb: Secondary | ICD-10-CM

## 2013-02-22 DIAGNOSIS — G5730 Lesion of lateral popliteal nerve, unspecified lower limb: Secondary | ICD-10-CM

## 2013-02-22 DIAGNOSIS — Z0289 Encounter for other administrative examinations: Secondary | ICD-10-CM

## 2013-02-22 NOTE — Procedures (Signed)
  HISTORY:  Sandra Villa is a 57 year old patient with a two-month history of a footdrop on the left leg, unassociated with significant discomfort. One month ago, she developed some right-sided back pain without radiation down the leg. The patient is being evaluated for a possible neuropathy or a lumbosacral radiculopathy. The patient has weakness with dorsiflexion and eversion of the left foot. The patient reports some numbness on the dorsum of the foot on the left.  NERVE CONDUCTION STUDIES:  Nerve conduction studies were performed on both lower extremities. The distal motor latencies for the peroneal and posterior tibial nerves were normal bilaterally with a low motor amplitude on the right peroneal nerve, normal on the left. There is a significant drop off of motor amplitudes across the fibular head for the left peroneal nerve, however. The motor amplitudes for the posterior tibial nerves are normal bilaterally. The nerve conduction velocities for the peroneal nerves are normal below the knee, slowed above the knee on the left, normal above the knee on the right. The nerve conduction velocities for the posterior tibial nerves were normal bilaterally. The F wave latencies for the peroneal nerves were prolonged on the left, normal on the right, and normal for the right posterior tibial nerve, slightly prolonged for the left posterior tibial nerve. The H reflex latencies were unobtainable bilaterally. The sensory latencies for the peroneal nerves are normal bilaterally.  EMG STUDIES:  EMG study was performed on the left lower extremity:  The tibialis anterior muscle reveals 2 to 4K motor units with decreased recruitment. 2+ fibrillations and positive waves were seen. The peroneus tertius muscle reveals 2 to 4K motor units with decreased recruitment. 2+ fibrillations and positive waves were seen. The medial gastrocnemius muscle reveals 1 to 3K motor units with full recruitment. No fibrillations or  positive waves were seen. The vastus lateralis muscle reveals 2 to 4K motor units with full recruitment. No fibrillations or positive waves were seen. The iliopsoas muscle reveals 2 to 4K motor units with full recruitment. No fibrillations or positive waves were seen. The biceps femoris muscle (long head) reveals 2 to 4K motor units with full recruitment. No fibrillations or positive waves were seen. The lumbosacral paraspinal muscles were tested at 3 levels, and revealed no abnormalities of insertional activity at the upper and middle levels tested. One plus fibrillations and positive waves were seen at the lower level. There was good relaxation.   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 There is sensory sparing, however for the left peroneal nerve. 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 Minimal acute denervation seen in the lower lumbosacral paraspinal muscles on the left, and a mild overlying lumbosacral radiculopathy cannot be excluded. 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  Marlan Palau MD 02/22/2013 4:34 PM  [facility] [address] [place], [place] [address]  Phone [phone] Fax [phone]

## 2013-02-23 ENCOUNTER — Telehealth: Payer: Self-pay | Admitting: *Deleted

## 2013-02-23 NOTE — Telephone Encounter (Signed)
Returning call for Lab Results

## 2013-02-27 NOTE — Telephone Encounter (Signed)
I see no labs, she may be calling re: EMG/[place] study results.

## 2013-02-27 NOTE — Telephone Encounter (Signed)
Left message for patient. Will call again to discuss EMG/NCS results

## 2013-03-01 NOTE — Telephone Encounter (Signed)
Results discussed per Dr. Hosie Poisson.

## 2013-04-05 ENCOUNTER — Other Ambulatory Visit: Payer: Self-pay

## 2013-04-05 ENCOUNTER — Encounter (INDEPENDENT_AMBULATORY_CARE_PROVIDER_SITE_OTHER): Payer: Managed Care, Other (non HMO)

## 2013-05-10 ENCOUNTER — Encounter (INDEPENDENT_AMBULATORY_CARE_PROVIDER_SITE_OTHER): Payer: Managed Care, Other (non HMO)

## 2013-05-17 ENCOUNTER — Encounter (INDEPENDENT_AMBULATORY_CARE_PROVIDER_SITE_OTHER): Payer: Self-pay | Admitting: Physician Assistant

## 2013-05-17 ENCOUNTER — Encounter: Payer: Self-pay | Admitting: Internal Medicine

## 2013-05-17 ENCOUNTER — Ambulatory Visit (INDEPENDENT_AMBULATORY_CARE_PROVIDER_SITE_OTHER): Payer: Managed Care, Other (non HMO) | Admitting: Physician Assistant

## 2013-05-17 VITALS — BP 136/90 | HR 60 | Temp 98.0°F | Resp 18 | Ht 65.5 in | Wt 171.0 lb

## 2013-05-17 DIAGNOSIS — Z4651 Encounter for fitting and adjustment of gastric lap band: Secondary | ICD-10-CM

## 2013-05-17 NOTE — Patient Instructions (Signed)

## 2013-05-17 NOTE — Progress Notes (Signed)
  HISTORY: Sandra Villa is a 57 y.o.female who received an AP-Standard lap-band in December 2013 by Dr. Daphine Deutscher. She comes in with 12 lbs gain in the past 4 months. She describes having a migraine with about 6 hours of severe vomiting. Slowly after that, she began having increasing appetite and weight gain. No regurgitation or reflux symptoms at all.Marland Kitchen  VITAL SIGNS: Filed Vitals:   05/17/13 0837  BP: 136/90  Pulse: 60  Temp: 98 F (36.7 C)  Resp: 18    PHYSICAL EXAM: Physical exam reveals a very well-appearing 57 y.o.female in no apparent distress Neurologic: Awake, alert, oriented Psych: Bright affect, conversant Respiratory: Breathing even and unlabored. No stridor or wheezing Abdomen: Soft, nontender, nondistended to palpation. Incisions well-healed. No incisional hernias. Port easily palpated. Extremities: Atraumatic, good range of motion.  ASSESMENT: 57 y.o.  female  s/p AP-Standard lap-band.   PLAN: The patient's port was accessed with a 20G Huber needle without difficulty. Clear fluid was aspirated and 0.5 mL saline was added to the port. The patient was able to swallow water without difficulty following the procedure and was instructed to take clear liquids for the next 24-48 hours and advance slowly as tolerated. This is her one-year anniversary appointment. We'll have her back in one month to follow on her weight progress.

## 2013-06-14 ENCOUNTER — Encounter (INDEPENDENT_AMBULATORY_CARE_PROVIDER_SITE_OTHER): Payer: Managed Care, Other (non HMO)

## 2013-06-22 ENCOUNTER — Ambulatory Visit: Payer: Managed Care, Other (non HMO) | Admitting: Neurology

## 2013-07-05 ENCOUNTER — Encounter (INDEPENDENT_AMBULATORY_CARE_PROVIDER_SITE_OTHER): Payer: Managed Care, Other (non HMO)

## 2013-07-26 ENCOUNTER — Encounter (INDEPENDENT_AMBULATORY_CARE_PROVIDER_SITE_OTHER): Payer: Managed Care, Other (non HMO)

## 2013-08-21 ENCOUNTER — Ambulatory Visit: Payer: Managed Care, Other (non HMO) | Admitting: Dietician

## 2013-08-23 ENCOUNTER — Encounter (INDEPENDENT_AMBULATORY_CARE_PROVIDER_SITE_OTHER): Payer: Managed Care, Other (non HMO)

## 2013-08-31 ENCOUNTER — Encounter: Payer: Managed Care, Other (non HMO) | Attending: Family Medicine | Admitting: Dietician

## 2013-08-31 VITALS — Ht 66.0 in | Wt 181.0 lb

## 2013-08-31 DIAGNOSIS — R634 Abnormal weight loss: Secondary | ICD-10-CM | POA: Insufficient documentation

## 2013-08-31 DIAGNOSIS — E669 Obesity, unspecified: Secondary | ICD-10-CM

## 2013-08-31 DIAGNOSIS — Z9884 Bariatric surgery status: Secondary | ICD-10-CM | POA: Insufficient documentation

## 2013-08-31 DIAGNOSIS — Z713 Dietary counseling and surveillance: Secondary | ICD-10-CM | POA: Insufficient documentation

## 2013-08-31 NOTE — Progress Notes (Signed)
Follow-up visit:  13 Months Post-Operative LAGB Surgery  Medical Nutrition Therapy:  Appt start time: 0845 end time:  0915.  Primary concerns today: Post-operative Bariatric Surgery Nutrition Management. Sandra Villa returns today with a 20-pound weight gain since her last visit 6 months ago. She attributes her weight gain to life stress. She reports plans to resume her previous exercise routine. She also is scheduled for a lap band fill next week. Sandra Villa is a vegetarian and gets her protein via Premier protein shakes, cottage cheese, yogurt, other cheese, walnuts, almonds, whole grains, and vegetables. She estimates around 60 grams of protein per day. Sandra Villa reports having difficulty meeting 64 oz of fluid per day. She also states that her blood sugars have been higher r/t weight gain. Last A1c 6.4%.  Surgery date: 05/22/12  Surgery type: LAGB  [facility] (01/26/12)  Highest pt reported pre-op wt: 259 lbs (@PCP )  Weight today: 181.0 lbs  Weight change: +10 lbs  Total weight lost: 68.4 lbs  Weight loss goal: 155-160 lbs   TANITA  BODY COMP RESULTS  07/31/12 08/16/12 11/07/12 02/21/13 08/31/2013   BMI (kg/m^2) 31.9 31.1 27.6 26.4 29.2   Fat Mass (lbs) 90.5 85.0 68.0 58.0 77.5   Fat Free Mass (lbs) 101.5 105.0 100.5 103.0 103.5   Total Body Water (lbs) 74.5 77.0 73.5 75.5 76   Fluid intake:  <64 oz per patient report Estimated total protein intake: ~60g  Medications:  No changes Supplementation: not taking MVI  CBG monitoring: Daily FBG  Average FBG per patient: 135 mg  Last pt reported A1c: 6.4%  Using straws: Yes; no reported gas pain Drinking while eating: No Hair loss: No Carbonated beverages: No N/V/D/C:  Constipation resolved with coffee Last Lap-Band fill: Last fill January, unknown cc's  Recent physical activity:  Exercising 2x per week. Was previously exercising 5 days per week  Progress Towards Goal(s):  In progress.  Nutritional Diagnosis:  [place]-3.3  Overweight/obesity related to past poor dietary habits and physical inactivity as evidenced by patient w/ recent LAGB surgery following dietary guidelines for continued weight loss.    Intervention:  Nutrition education.  Monitoring/Evaluation:  Dietary intake, exercise, lap band fills, and body weight. Follow up in 6 weeks for 15 month post-op visit.

## 2013-08-31 NOTE — Patient Instructions (Addendum)
-  Get back into more consistent exercise routine -Keep fluid intake at 64 oz or greater per day   07/31/12 08/16/12 11/07/12 02/21/13 08/31/2013   BMI (kg/m^2) 31.9 31.1 27.6 26.4 29.2   Fat Mass (lbs) 90.5 85.0 68.0 58.0 77.5   Fat Free Mass (lbs) 101.5 105.0 100.5 103.0 103.5   Total Body Water (lbs) 74.5 77.0 73.5 75.5 76

## 2013-09-06 ENCOUNTER — Encounter (INDEPENDENT_AMBULATORY_CARE_PROVIDER_SITE_OTHER): Payer: Self-pay

## 2013-09-06 ENCOUNTER — Ambulatory Visit (INDEPENDENT_AMBULATORY_CARE_PROVIDER_SITE_OTHER): Payer: Managed Care, Other (non HMO) | Admitting: Physician Assistant

## 2013-09-06 VITALS — BP 144/90 | HR 88 | Temp 98.6°F | Resp 14 | Ht 66.0 in | Wt 179.8 lb

## 2013-09-06 DIAGNOSIS — Z9884 Bariatric surgery status: Secondary | ICD-10-CM

## 2013-09-06 NOTE — Patient Instructions (Signed)
Return in one month. Focus on good food choices as well as physical activity. Return sooner if you have an increase in hunger, portion sizes or weight. Return also for difficulty swallowing, night cough, reflux.   

## 2013-09-06 NOTE — Progress Notes (Signed)
  HISTORY: Sandra Villa is a 58 y.o.female who received an AP-Standard lap-band in December 2013 by Dr. Hassell Done. She comes in with close to 9 lbs weight gain since her last visit about 4 months ago. She has lost 42 lbs since surgery. Over the past couple of months her work schedule is requiring travel every other week, hindering her from better controlling her diet. She has met with nutrition who has recommended taking in about 1200 cal/day, over the 900 calories she's been taking in previously. She has increased her exercise to 5 days per week. Currently she is having six small meals a day and is satisfied with this. She is not particularly hungry.  VITAL SIGNS: Filed Vitals:   09/06/13 0910  BP: 144/90  Pulse: 88  Temp: 98.6 F (37 C)  Resp: 14    PHYSICAL EXAM: Physical exam reveals a very well-appearing 58 y.o.female in no apparent distress Neurologic: Awake, alert, oriented Psych: Bright affect, conversant Respiratory: Breathing even and unlabored. No stridor or wheezing Extremities: Atraumatic, good range of motion. Skin: Warm, Dry, no rashes Musculoskeletal: Normal gait, Joints normal  ASSESMENT: 58 y.o.  female  s/p AP-Standard lap-band.   PLAN: As she is making adjustments in her diet and exercise regimen, and that her hunger overall is under good control, we deferred a fill today. We'll have her back in one month to re-evaluate. If she's continuing to gain weight, we will consider a fill at that time.

## 2013-10-04 ENCOUNTER — Encounter (INDEPENDENT_AMBULATORY_CARE_PROVIDER_SITE_OTHER): Payer: Managed Care, Other (non HMO)

## 2013-10-16 ENCOUNTER — Ambulatory Visit: Payer: Managed Care, Other (non HMO) | Admitting: Dietician

## 2013-11-05 ENCOUNTER — Ambulatory Visit: Payer: Managed Care, Other (non HMO) | Admitting: Dietician

## 2015-01-08 ENCOUNTER — Other Ambulatory Visit: Payer: Self-pay | Admitting: Orthopedic Surgery

## 2015-01-08 DIAGNOSIS — Z01818 Encounter for other preprocedural examination: Secondary | ICD-10-CM

## 2015-01-15 ENCOUNTER — Ambulatory Visit
Admission: RE | Admit: 2015-01-15 | Discharge: 2015-01-15 | Disposition: A | Discharge status: Home / Self Care | Payer: Managed Care, Other (non HMO) | Source: Ambulatory Visit | Attending: Orthopedic Surgery | Admitting: Orthopedic Surgery

## 2015-01-15 DIAGNOSIS — Z01818 Encounter for other preprocedural examination: Secondary | ICD-10-CM

## 2015-06-19 ENCOUNTER — Other Ambulatory Visit: Payer: Self-pay | Admitting: Family Medicine

## 2015-06-19 DIAGNOSIS — Z1231 Encounter for screening mammogram for malignant neoplasm of breast: Secondary | ICD-10-CM

## 2015-07-01 ENCOUNTER — Ambulatory Visit
Admission: RE | Admit: 2015-07-01 | Discharge: 2015-07-01 | Disposition: A | Discharge status: Home / Self Care | Payer: Managed Care, Other (non HMO) | Source: Ambulatory Visit | Attending: Family Medicine | Admitting: Family Medicine

## 2015-07-01 DIAGNOSIS — Z1231 Encounter for screening mammogram for malignant neoplasm of breast: Secondary | ICD-10-CM

## 2016-07-30 ENCOUNTER — Other Ambulatory Visit: Payer: Self-pay | Admitting: Family Medicine

## 2016-07-30 DIAGNOSIS — Z1231 Encounter for screening mammogram for malignant neoplasm of breast: Secondary | ICD-10-CM

## 2016-08-18 ENCOUNTER — Ambulatory Visit: Payer: Managed Care, Other (non HMO)

## 2016-12-23 ENCOUNTER — Encounter (HOSPITAL_COMMUNITY): Payer: Self-pay

## 2017-02-23 ENCOUNTER — Ambulatory Visit
Admission: RE | Admit: 2017-02-23 | Discharge: 2017-02-23 | Disposition: A | Discharge status: Home / Self Care | Payer: Managed Care, Other (non HMO) | Source: Ambulatory Visit | Attending: Surgery | Admitting: Surgery

## 2017-02-23 ENCOUNTER — Other Ambulatory Visit: Payer: Self-pay | Admitting: Surgery

## 2017-02-23 DIAGNOSIS — Z4651 Encounter for fitting and adjustment of gastric lap band: Secondary | ICD-10-CM

## 2017-09-15 ENCOUNTER — Ambulatory Visit
Admission: RE | Admit: 2017-09-15 | Discharge: 2017-09-15 | Disposition: A | Discharge status: Home / Self Care | Payer: Managed Care, Other (non HMO) | Source: Ambulatory Visit | Attending: Surgery | Admitting: Surgery

## 2017-09-15 ENCOUNTER — Other Ambulatory Visit: Payer: Self-pay | Admitting: Surgery

## 2017-09-15 ENCOUNTER — Telehealth: Payer: Self-pay | Admitting: Surgery

## 2017-09-15 DIAGNOSIS — Z9884 Bariatric surgery status: Secondary | ICD-10-CM

## 2017-09-15 MED ORDER — IOPAMIDOL (ISOVUE-300) INJECTION 61%
100.0000 mL | Freq: Once | INTRAVENOUS | Status: AC | PRN
  Administered 2017-09-15: 125 mL via INTRAVENOUS

## 2017-09-15 NOTE — Telephone Encounter (Signed)
Ms. Earlene PlaterDavis was seen yesterday by Dr. Daphine DeutscherMartin. She has a history of a lap band.  She had some abdominal pain.  Dr. Daphine DeutscherMartin ordered a CT scan.  The CT scan was read by Dr. Margo AyeHall.  Dr. Andrey CampanileWilson talked to Dr. Margo AyeHall about the CT scan.  There is no surgical problem with the scan.  But the patient has edema involving both small bowel and colon.  In talking to her on the phone, she has been through two courses of antibiotics over the last 3 weeks.  She is keeping liquids down, and though she had diarrhea last week, she is doing better with this.  She says that he abdominal pain is a 3 - 4.  I talked to her about antibiotic induced diarrhea/abdominal pain.  She needs to contact her PCP for further evaluation.  Since she is not that ill and she has already been through 2 courses of antibiotics - I would not treat her until I had a definite diagnosis.  They should have access to the CT scan report.  They can also call us if there is a question.  Ovidio Kinavid Dakoda Bassette, MD, [facility] Pager: [phone] Office phone:  [phone]

## 2017-09-16 ENCOUNTER — Emergency Department (HOSPITAL_COMMUNITY)
Admission: EM | Admit: 2017-09-16 | Discharge: 2017-09-16 | Disposition: A | Discharge status: Home / Self Care | Payer: Managed Care, Other (non HMO) | Attending: Emergency Medicine | Admitting: Emergency Medicine

## 2017-09-16 ENCOUNTER — Encounter (HOSPITAL_COMMUNITY): Payer: Self-pay | Admitting: Emergency Medicine

## 2017-09-16 ENCOUNTER — Other Ambulatory Visit: Payer: Self-pay

## 2017-09-16 DIAGNOSIS — R109 Unspecified abdominal pain: Secondary | ICD-10-CM | POA: Insufficient documentation

## 2017-09-16 DIAGNOSIS — Z5321 Procedure and treatment not carried out due to patient leaving prior to being seen by health care provider: Secondary | ICD-10-CM | POA: Diagnosis not present

## 2017-09-16 LAB — COMPREHENSIVE METABOLIC PANEL
ALT: 15 U/L (ref 14–54)
ANION GAP: 13 (ref 5–15)
AST: 20 U/L (ref 15–41)
Albumin: 3.2 g/dL — ABNORMAL LOW (ref 3.5–5.0)
Alkaline Phosphatase: 81 U/L (ref 38–126)
BUN: 9 mg/dL (ref 6–20)
CHLORIDE: 106 mmol/L (ref 101–111)
CO2: 22 mmol/L (ref 22–32)
Calcium: 9.1 mg/dL (ref 8.9–10.3)
Creatinine, Ser: 0.73 mg/dL (ref 0.44–1.00)
GFR calc non Af Amer: >60 mL/min (ref >60)
Glucose, Bld: 177 mg/dL — ABNORMAL HIGH (ref 65–99)
POTASSIUM: 3.8 mmol/L (ref 3.5–5.1)
SODIUM: 141 mmol/L (ref 135–145)
Total Bilirubin: 0.8 mg/dL (ref 0.3–1.2)
Total Protein: 6.2 g/dL — ABNORMAL LOW (ref 6.5–8.1)

## 2017-09-16 LAB — URINALYSIS, ROUTINE W REFLEX MICROSCOPIC
BILIRUBIN URINE: NEGATIVE
Glucose, UA: NEGATIVE mg/dL
Hgb urine dipstick: NEGATIVE
KETONES UR: NEGATIVE mg/dL
Nitrite: NEGATIVE
PROTEIN: 30 mg/dL — AB
Specific Gravity, Urine: >1.046 — ABNORMAL HIGH (ref 1.005–1.030)
pH: 5 (ref 5.0–8.0)

## 2017-09-16 LAB — CBC
HCT: 44.1 % (ref 36.0–46.0)
HEMOGLOBIN: 14.9 g/dL (ref 12.0–15.0)
MCH: 29.3 pg (ref 26.0–34.0)
MCHC: 33.8 g/dL (ref 30.0–36.0)
MCV: 86.8 fL (ref 78.0–100.0)
Platelets: 248 10*3/uL (ref 150–400)
RBC: 5.08 MIL/uL (ref 3.87–5.11)
RDW: 14.1 % (ref 11.5–15.5)
WBC: 21.9 10*3/uL — ABNORMAL HIGH (ref 4.0–10.5)

## 2017-09-16 LAB — LIPASE, BLOOD: LIPASE: 27 U/L (ref 11–51)

## 2017-09-16 NOTE — ED Triage Notes (Signed)
Pt. Stated, I had a CAT scan yesterday morning for off an on diarrhea and they called and said I had some type of bacteria and to come back, office closed today. Ive had bouts of diarrhea on and off for about 2 weeks and was on antibiotics for a burst sebaceous  cyst and it might be the antibiotics causing the diarrhea

## 2017-09-16 NOTE — ED Notes (Signed)
When checking pts vitals, pt asked how long wait time was. This NT explained that the longest wait is 10hrs, pt stated "I can't wait 10 hours". This NT explained that wait times vary per patient. Pt stated she was leaving.

## 2019-12-10 ENCOUNTER — Other Ambulatory Visit: Payer: Self-pay | Admitting: Gastroenterology

## 2019-12-10 ENCOUNTER — Other Ambulatory Visit (HOSPITAL_COMMUNITY): Payer: Self-pay | Admitting: Gastroenterology

## 2019-12-10 DIAGNOSIS — R110 Nausea: Secondary | ICD-10-CM

## 2020-01-29 IMAGING — CT CT ABD-PELV W/ CM
2 of 5 series · 15 of 46 positions shown, 17 images · IV contrast (iopamidol)
Comparison: Abdominal radiographs 02/23/2017 and earlier. Chest
radiographs 02/09/2012. Left hip CT 01/15/2015.

ADDENDUM:
Study discussed by telephone with Dr. Cesia Daley (covering for Dr.
ALIOJZY AMULIS) on 09/15/2017 at 9266 hours.
CLINICAL DATA: 61-year-old with history of gastric banding in 1462.
Two weeks of abdominal/umbilical pain.

Creatinine was obtained on site at [HOSPITAL] at [REDACTED].
Results: Creatinine 0.8 mg/dL.
EXAM:
CT ABDOMEN AND PELVIS WITH CONTRAST
TECHNIQUE: Multidetector CT imaging of the abdomen and pelvis was performed
using the standard protocol following bolus administration of
intravenous contrast.
CONTRAST:  125mL [id number] IOPAMIDOL ([id number]) INJECTION 61%

[Series 2: abd pelvis 5.00 br40 s3 ax · axial · 0.83mm/px · z∈[+1439,+1864]mm · 12 of 95 slices shown, 14 images]
[im 5/95  soft-tissue]
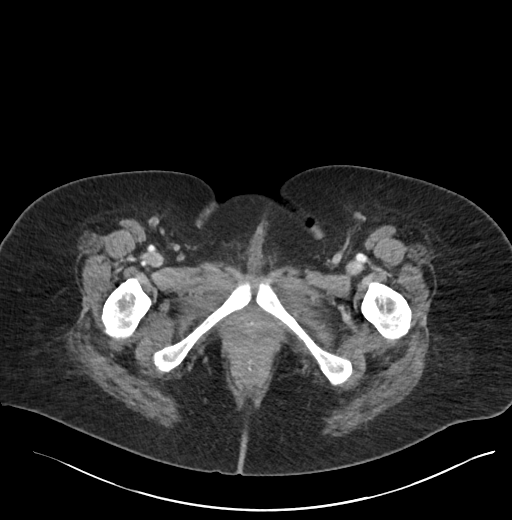
[im 5/95  bone]
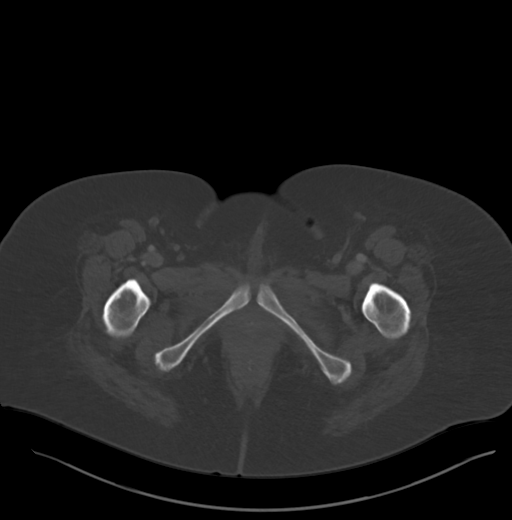
[im 14/95  soft-tissue]
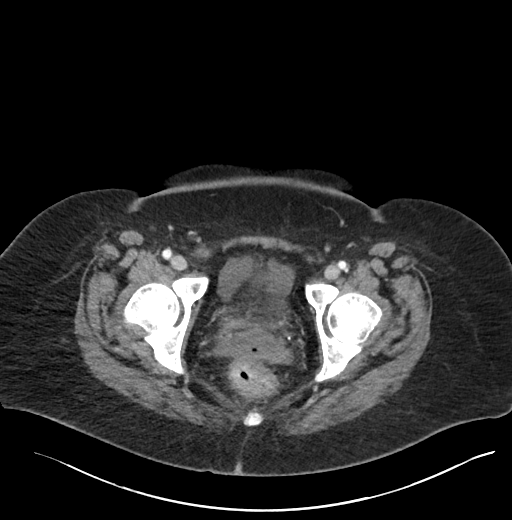
[im 23/95  soft-tissue]
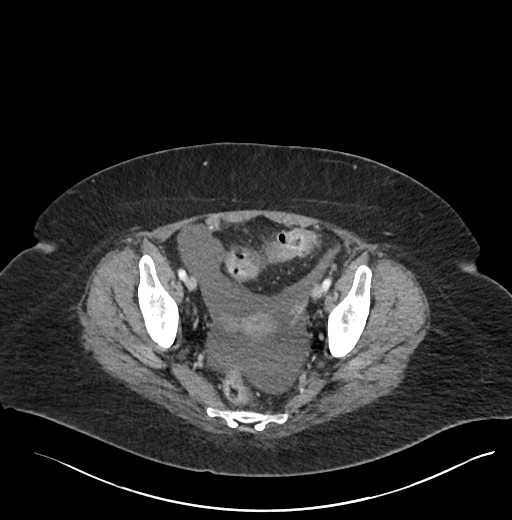
[im 27/95  soft-tissue]
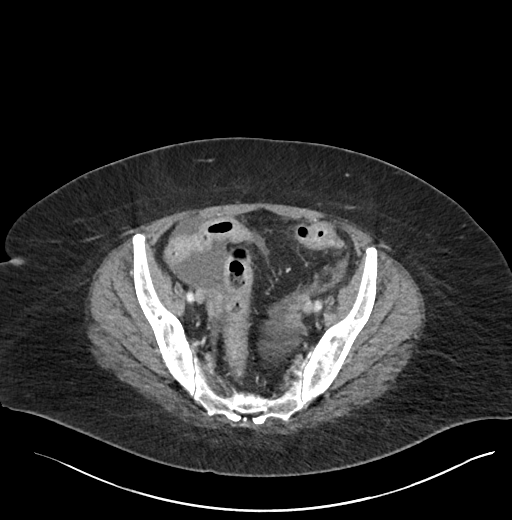
[im 36/95  soft-tissue]
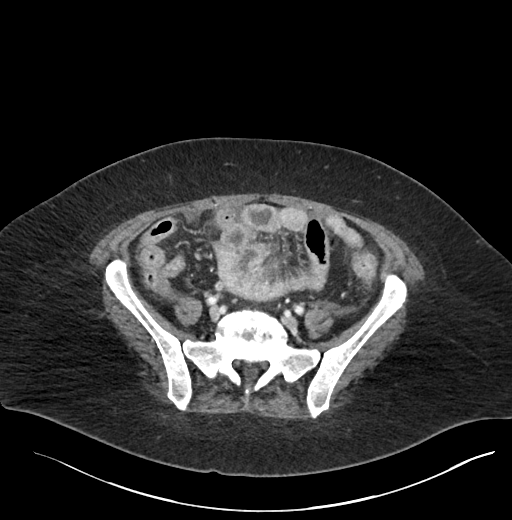
[im 45/95  soft-tissue]
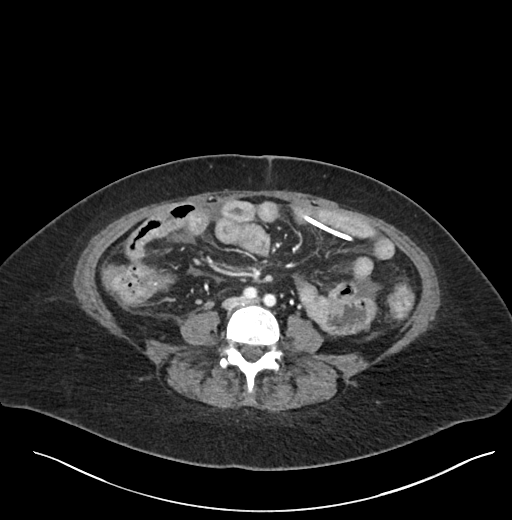
[im 50/95  soft-tissue]
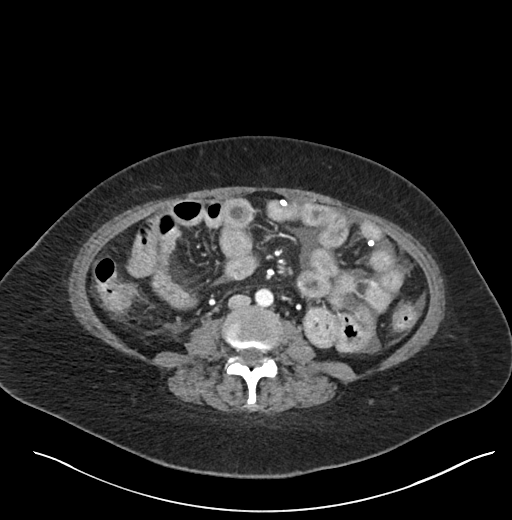
[im 59/95  soft-tissue]
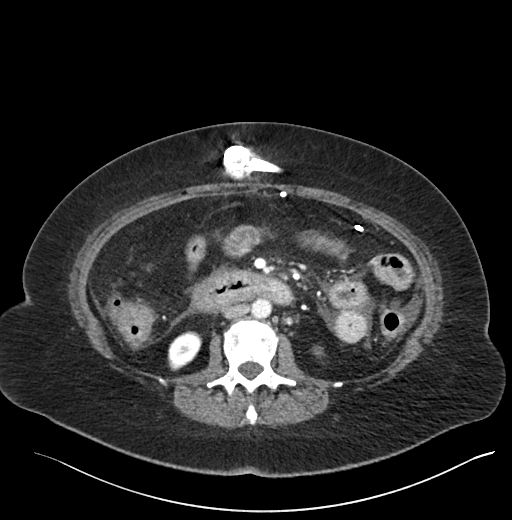
[im 68/95  soft-tissue]
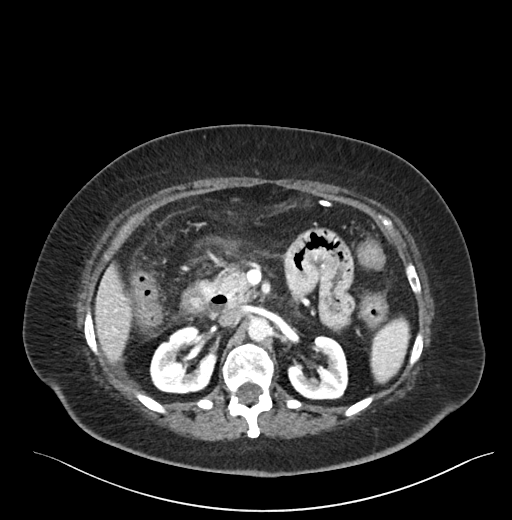
[im 68/95  bone]
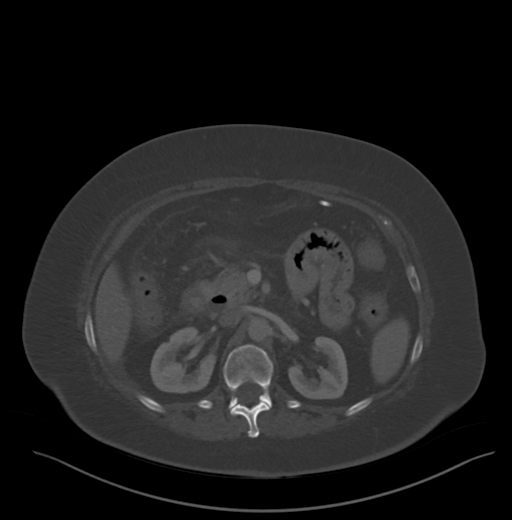
[im 72/95  soft-tissue]
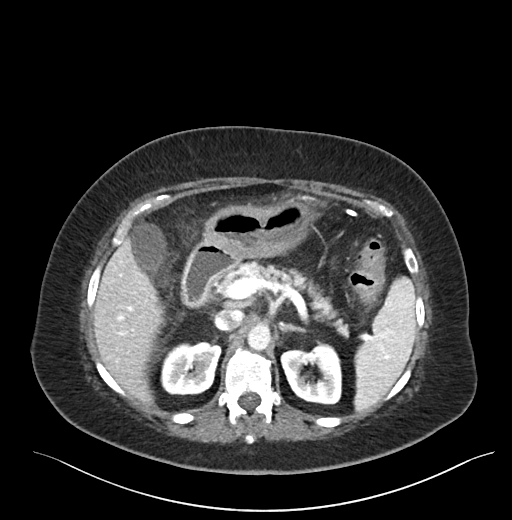
[im 81/95  soft-tissue]
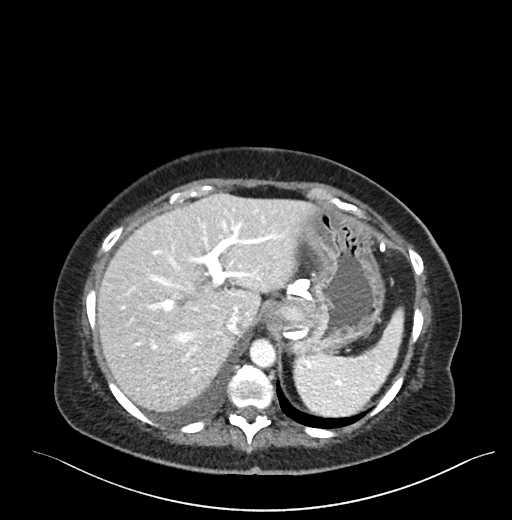
[im 90/95  soft-tissue]
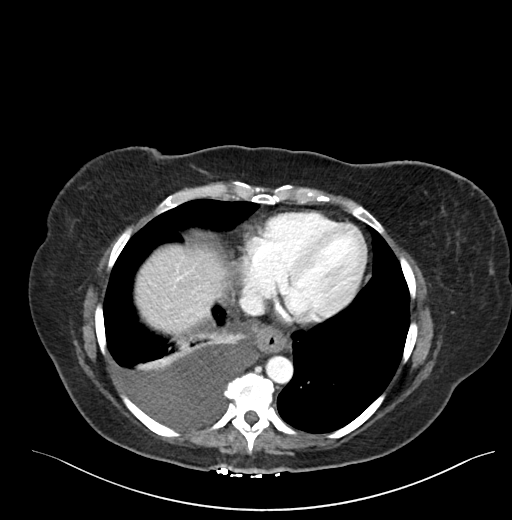

[Series 6: abd pelvis 2.00 br40 s3 cor · coronal · 0.83mm/px · 3 of 149 slices shown]
[im 50/149  soft-tissue]
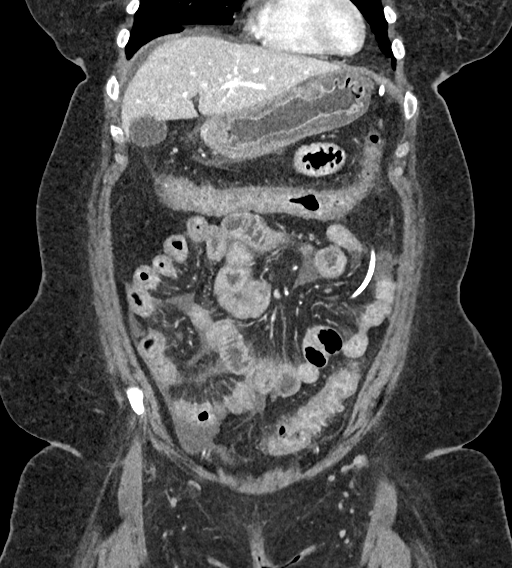
[im 66/149  soft-tissue]
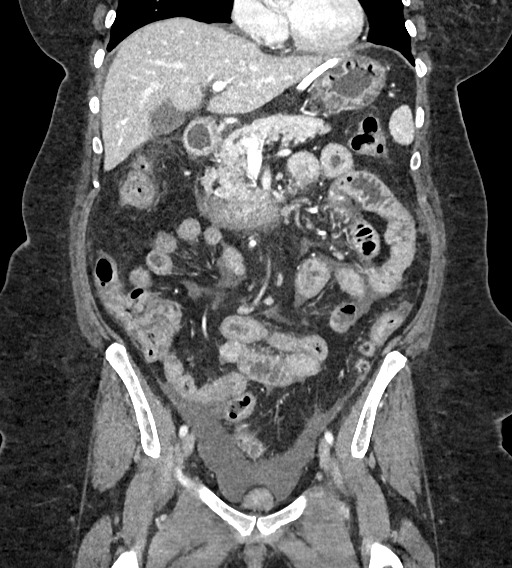
[im 83/149  soft-tissue]
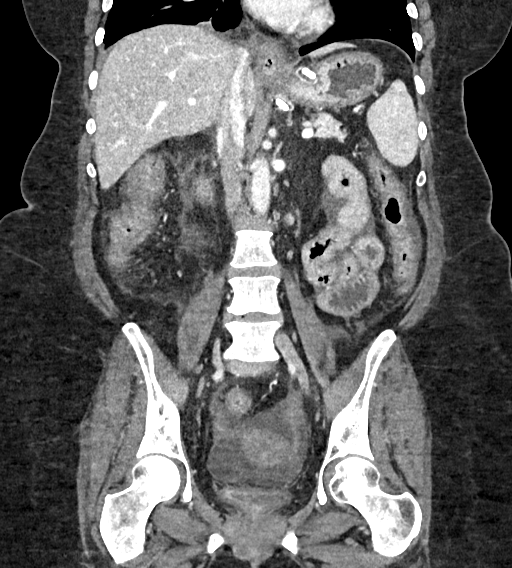

[15 of 46 positions shown; findings below may reference images not displayed]

FINDINGS: Lower chest: There is a small to moderate size layering right
pleural effusion with simple fluid density (6 Hounsfield units)
suggesting a transudate. There is associated compressive atelectasis
in the right lower lobe.

No pericardial or left pleural effusion. The left lung base is
clear.

Hepatobiliary: Small volume perihepatic free fluid. The liver
enhancement is within normal limits. Gallbladder wall appears
normal. There is mild right upper quadrant region inflammatory
stranding, but that appears to be secondary to the large bowel-see
below. No biliary ductal enlargement.

Pancreas: Negative. A small duodenal diverticulum is suspected at
the level of the pancreatic head.

Spleen: Negative.

Adrenals/Urinary Tract: Normal adrenal glands. Bilateral renal
enhancement and contrast excretion appears symmetric and normal. No
hydronephrosis or hydroureter.

The urinary bladder is completely decompressed. However, there is
suggestion of urinary bladder wall hyperenhancement (series 2, image
84).

Stomach/Bowel: Diffusely abnormal colonic wall edema with thickening
and indistinct appearance. Involvement from the cecum to the rectum.
The appendix is relatively spared. There are superimposed
diverticula in the distal descending and proximal sigmoid colon,
moderate extent.

Furthermore, there is similar diffuse mural edema and
hyperenhancement throughout the small bowel from the duodenum to the
terminal ileum. However, the stomach seems to be relatively spared.

There is associated scattered small bowel mesentery free fluid.
There is mesenteric stranding and free fluid along the ascending,
descending, and transverse colon. No pneumoperitoneum.

Superimposed gastric banding. The device and associated. No adverse
features are catheter appear intact identified.

Vascular/Lymphatic: Mild Calcified aortic atherosclerosis. Major
arterial structures in the abdomen and pelvis are patent. The portal
venous system is patent.

No lymphadenopathy.

Reproductive: Negative.

Other: Small to moderate volume of pelvic free fluid with simple
fluid density.

Musculoskeletal: No acute osseous abnormality identified.
IMPRESSION: 1. Diffusely inflamed bowel from the duodenum to the rectum. The
stomach and appendix appear relatively spared. Favor this is an
acute infectious enterocolitis. No evidence of obstruction or
perforation.
2. Small volume free fluid in the abdomen and moderate volume of
free fluid layering in the pelvis with simple fluid density
suggesting reactive transudate. No organized fluid collection or
abscess identified.
3. Moderate layering right pleural effusion also suspected to be
transudate. Associated right lower lobe atelectasis.
4. Suggestion of abnormal enhancement of the urinary bladder mucosa.
Consider superimposed UTI.
5. Gastric band with no adverse features identified.

## 2020-02-26 ENCOUNTER — Other Ambulatory Visit: Payer: Self-pay | Admitting: Student

## 2020-02-26 DIAGNOSIS — R109 Unspecified abdominal pain: Secondary | ICD-10-CM

## 2020-03-07 ENCOUNTER — Ambulatory Visit
Admission: RE | Admit: 2020-03-07 | Discharge: 2020-03-07 | Disposition: A | Discharge status: Home / Self Care | Payer: No Typology Code available for payment source | Source: Ambulatory Visit | Attending: Student | Admitting: Student

## 2020-03-07 DIAGNOSIS — R109 Unspecified abdominal pain: Secondary | ICD-10-CM

## 2020-03-07 MED ORDER — IOPAMIDOL (ISOVUE-300) INJECTION 61%
100.0000 mL | Freq: Once | INTRAVENOUS | Status: AC | PRN
  Administered 2020-03-07: 100 mL via INTRAVENOUS

## 2021-12-08 ENCOUNTER — Other Ambulatory Visit: Payer: Self-pay | Admitting: Orthopedic Surgery

## 2021-12-08 DIAGNOSIS — G8929 Other chronic pain: Secondary | ICD-10-CM

## 2021-12-20 ENCOUNTER — Ambulatory Visit
Admission: RE | Admit: 2021-12-20 | Discharge: 2021-12-20 | Disposition: A | Discharge status: Home / Self Care | Payer: No Typology Code available for payment source | Source: Ambulatory Visit | Attending: Orthopedic Surgery | Admitting: Orthopedic Surgery

## 2021-12-20 DIAGNOSIS — G8929 Other chronic pain: Secondary | ICD-10-CM

## 2021-12-31 DIAGNOSIS — E119 Type 2 diabetes mellitus without complications: Secondary | ICD-10-CM | POA: Diagnosis not present

## 2022-01-05 DIAGNOSIS — R29898 Other symptoms and signs involving the musculoskeletal system: Secondary | ICD-10-CM | POA: Diagnosis not present

## 2022-01-05 DIAGNOSIS — R296 Repeated falls: Secondary | ICD-10-CM | POA: Diagnosis not present

## 2022-01-05 DIAGNOSIS — G8929 Other chronic pain: Secondary | ICD-10-CM | POA: Diagnosis not present

## 2022-01-05 DIAGNOSIS — M2352 Chronic instability of knee, left knee: Secondary | ICD-10-CM | POA: Diagnosis not present

## 2022-01-05 DIAGNOSIS — M25562 Pain in left knee: Secondary | ICD-10-CM | POA: Diagnosis not present

## 2022-01-12 DIAGNOSIS — R296 Repeated falls: Secondary | ICD-10-CM | POA: Diagnosis not present

## 2022-01-12 DIAGNOSIS — R29898 Other symptoms and signs involving the musculoskeletal system: Secondary | ICD-10-CM | POA: Diagnosis not present

## 2022-01-12 DIAGNOSIS — M2352 Chronic instability of knee, left knee: Secondary | ICD-10-CM | POA: Diagnosis not present

## 2022-01-19 DIAGNOSIS — Z96652 Presence of left artificial knee joint: Secondary | ICD-10-CM | POA: Diagnosis not present

## 2022-01-19 DIAGNOSIS — T8484XA Pain due to internal orthopedic prosthetic devices, implants and grafts, initial encounter: Secondary | ICD-10-CM | POA: Diagnosis not present

## 2022-03-03 DIAGNOSIS — I10 Essential (primary) hypertension: Secondary | ICD-10-CM | POA: Diagnosis not present

## 2022-03-03 DIAGNOSIS — E119 Type 2 diabetes mellitus without complications: Secondary | ICD-10-CM | POA: Diagnosis not present

## 2022-03-03 DIAGNOSIS — E785 Hyperlipidemia, unspecified: Secondary | ICD-10-CM | POA: Diagnosis not present

## 2022-03-03 DIAGNOSIS — E041 Nontoxic single thyroid nodule: Secondary | ICD-10-CM | POA: Diagnosis not present

## 2022-03-09 DIAGNOSIS — Z780 Asymptomatic menopausal state: Secondary | ICD-10-CM | POA: Diagnosis not present

## 2022-03-09 DIAGNOSIS — E041 Nontoxic single thyroid nodule: Secondary | ICD-10-CM | POA: Diagnosis not present

## 2022-03-09 DIAGNOSIS — M5136 Other intervertebral disc degeneration, lumbar region: Secondary | ICD-10-CM | POA: Diagnosis not present

## 2022-03-09 DIAGNOSIS — E118 Type 2 diabetes mellitus with unspecified complications: Secondary | ICD-10-CM | POA: Diagnosis not present

## 2022-03-09 DIAGNOSIS — Z0000 Encounter for general adult medical examination without abnormal findings: Secondary | ICD-10-CM | POA: Diagnosis not present

## 2022-03-09 DIAGNOSIS — Z13820 Encounter for screening for osteoporosis: Secondary | ICD-10-CM | POA: Diagnosis not present

## 2022-03-09 DIAGNOSIS — E785 Hyperlipidemia, unspecified: Secondary | ICD-10-CM | POA: Diagnosis not present

## 2022-03-09 DIAGNOSIS — Z9884 Bariatric surgery status: Secondary | ICD-10-CM | POA: Diagnosis not present

## 2022-03-09 DIAGNOSIS — Z23 Encounter for immunization: Secondary | ICD-10-CM | POA: Diagnosis not present

## 2022-05-17 DIAGNOSIS — G8918 Other acute postprocedural pain: Secondary | ICD-10-CM | POA: Diagnosis not present

## 2022-05-17 DIAGNOSIS — M1712 Unilateral primary osteoarthritis, left knee: Secondary | ICD-10-CM | POA: Diagnosis not present

## 2022-05-27 DIAGNOSIS — Z96652 Presence of left artificial knee joint: Secondary | ICD-10-CM | POA: Diagnosis not present

## 2022-05-27 DIAGNOSIS — M25662 Stiffness of left knee, not elsewhere classified: Secondary | ICD-10-CM | POA: Diagnosis not present

## 2022-05-27 DIAGNOSIS — M25462 Effusion, left knee: Secondary | ICD-10-CM | POA: Diagnosis not present

## 2022-05-27 DIAGNOSIS — R29898 Other symptoms and signs involving the musculoskeletal system: Secondary | ICD-10-CM | POA: Diagnosis not present

## 2022-05-27 DIAGNOSIS — M25562 Pain in left knee: Secondary | ICD-10-CM | POA: Diagnosis not present

## 2022-06-01 DIAGNOSIS — R29898 Other symptoms and signs involving the musculoskeletal system: Secondary | ICD-10-CM | POA: Diagnosis not present

## 2022-06-01 DIAGNOSIS — M25562 Pain in left knee: Secondary | ICD-10-CM | POA: Diagnosis not present

## 2022-06-01 DIAGNOSIS — Z96652 Presence of left artificial knee joint: Secondary | ICD-10-CM | POA: Diagnosis not present

## 2022-06-01 DIAGNOSIS — M25662 Stiffness of left knee, not elsewhere classified: Secondary | ICD-10-CM | POA: Diagnosis not present

## 2022-06-01 DIAGNOSIS — Z7409 Other reduced mobility: Secondary | ICD-10-CM | POA: Diagnosis not present

## 2022-06-01 DIAGNOSIS — M25462 Effusion, left knee: Secondary | ICD-10-CM | POA: Diagnosis not present

## 2022-06-04 DIAGNOSIS — Z96652 Presence of left artificial knee joint: Secondary | ICD-10-CM | POA: Diagnosis not present

## 2022-06-04 DIAGNOSIS — R29898 Other symptoms and signs involving the musculoskeletal system: Secondary | ICD-10-CM | POA: Diagnosis not present

## 2022-06-04 DIAGNOSIS — M25562 Pain in left knee: Secondary | ICD-10-CM | POA: Diagnosis not present

## 2022-06-04 DIAGNOSIS — M25662 Stiffness of left knee, not elsewhere classified: Secondary | ICD-10-CM | POA: Diagnosis not present

## 2022-06-04 DIAGNOSIS — Z7409 Other reduced mobility: Secondary | ICD-10-CM | POA: Diagnosis not present

## 2022-06-04 DIAGNOSIS — M25462 Effusion, left knee: Secondary | ICD-10-CM | POA: Diagnosis not present

## 2022-06-07 DIAGNOSIS — M25662 Stiffness of left knee, not elsewhere classified: Secondary | ICD-10-CM | POA: Diagnosis not present

## 2022-06-07 DIAGNOSIS — M25462 Effusion, left knee: Secondary | ICD-10-CM | POA: Diagnosis not present

## 2022-06-07 DIAGNOSIS — M25562 Pain in left knee: Secondary | ICD-10-CM | POA: Diagnosis not present

## 2022-06-07 DIAGNOSIS — R29898 Other symptoms and signs involving the musculoskeletal system: Secondary | ICD-10-CM | POA: Diagnosis not present

## 2022-06-07 DIAGNOSIS — Z96652 Presence of left artificial knee joint: Secondary | ICD-10-CM | POA: Diagnosis not present

## 2022-06-07 DIAGNOSIS — Z7409 Other reduced mobility: Secondary | ICD-10-CM | POA: Diagnosis not present

## 2022-06-10 DIAGNOSIS — M25662 Stiffness of left knee, not elsewhere classified: Secondary | ICD-10-CM | POA: Diagnosis not present

## 2022-06-10 DIAGNOSIS — Z96652 Presence of left artificial knee joint: Secondary | ICD-10-CM | POA: Diagnosis not present

## 2022-06-10 DIAGNOSIS — R29898 Other symptoms and signs involving the musculoskeletal system: Secondary | ICD-10-CM | POA: Diagnosis not present

## 2022-06-10 DIAGNOSIS — M25462 Effusion, left knee: Secondary | ICD-10-CM | POA: Diagnosis not present

## 2022-06-10 DIAGNOSIS — Z7409 Other reduced mobility: Secondary | ICD-10-CM | POA: Diagnosis not present

## 2022-06-10 DIAGNOSIS — M25562 Pain in left knee: Secondary | ICD-10-CM | POA: Diagnosis not present

## 2022-06-15 DIAGNOSIS — M25462 Effusion, left knee: Secondary | ICD-10-CM | POA: Diagnosis not present

## 2022-06-15 DIAGNOSIS — Z96652 Presence of left artificial knee joint: Secondary | ICD-10-CM | POA: Diagnosis not present

## 2022-06-15 DIAGNOSIS — Z7409 Other reduced mobility: Secondary | ICD-10-CM | POA: Diagnosis not present

## 2022-06-15 DIAGNOSIS — R29898 Other symptoms and signs involving the musculoskeletal system: Secondary | ICD-10-CM | POA: Diagnosis not present

## 2022-06-15 DIAGNOSIS — M25662 Stiffness of left knee, not elsewhere classified: Secondary | ICD-10-CM | POA: Diagnosis not present

## 2022-06-15 DIAGNOSIS — M25562 Pain in left knee: Secondary | ICD-10-CM | POA: Diagnosis not present

## 2022-06-17 DIAGNOSIS — M25562 Pain in left knee: Secondary | ICD-10-CM | POA: Diagnosis not present

## 2022-06-17 DIAGNOSIS — M25662 Stiffness of left knee, not elsewhere classified: Secondary | ICD-10-CM | POA: Diagnosis not present

## 2022-06-17 DIAGNOSIS — Z96652 Presence of left artificial knee joint: Secondary | ICD-10-CM | POA: Diagnosis not present

## 2022-06-17 DIAGNOSIS — R29898 Other symptoms and signs involving the musculoskeletal system: Secondary | ICD-10-CM | POA: Diagnosis not present

## 2022-06-17 DIAGNOSIS — Z7409 Other reduced mobility: Secondary | ICD-10-CM | POA: Diagnosis not present

## 2022-06-17 DIAGNOSIS — M25462 Effusion, left knee: Secondary | ICD-10-CM | POA: Diagnosis not present

## 2022-06-21 DIAGNOSIS — Z7409 Other reduced mobility: Secondary | ICD-10-CM | POA: Diagnosis not present

## 2022-06-21 DIAGNOSIS — M25662 Stiffness of left knee, not elsewhere classified: Secondary | ICD-10-CM | POA: Diagnosis not present

## 2022-06-21 DIAGNOSIS — M25462 Effusion, left knee: Secondary | ICD-10-CM | POA: Diagnosis not present

## 2022-06-21 DIAGNOSIS — R29898 Other symptoms and signs involving the musculoskeletal system: Secondary | ICD-10-CM | POA: Diagnosis not present

## 2022-06-21 DIAGNOSIS — M25562 Pain in left knee: Secondary | ICD-10-CM | POA: Diagnosis not present

## 2022-06-21 DIAGNOSIS — Z96652 Presence of left artificial knee joint: Secondary | ICD-10-CM | POA: Diagnosis not present

## 2022-06-24 DIAGNOSIS — M25662 Stiffness of left knee, not elsewhere classified: Secondary | ICD-10-CM | POA: Diagnosis not present

## 2022-06-24 DIAGNOSIS — Z96652 Presence of left artificial knee joint: Secondary | ICD-10-CM | POA: Diagnosis not present

## 2022-06-24 DIAGNOSIS — M25562 Pain in left knee: Secondary | ICD-10-CM | POA: Diagnosis not present

## 2022-06-24 DIAGNOSIS — R29898 Other symptoms and signs involving the musculoskeletal system: Secondary | ICD-10-CM | POA: Diagnosis not present

## 2022-06-24 DIAGNOSIS — M25462 Effusion, left knee: Secondary | ICD-10-CM | POA: Diagnosis not present

## 2022-06-24 DIAGNOSIS — Z7409 Other reduced mobility: Secondary | ICD-10-CM | POA: Diagnosis not present

## 2022-06-29 DIAGNOSIS — M25662 Stiffness of left knee, not elsewhere classified: Secondary | ICD-10-CM | POA: Diagnosis not present

## 2022-06-29 DIAGNOSIS — Z96652 Presence of left artificial knee joint: Secondary | ICD-10-CM | POA: Diagnosis not present

## 2022-06-29 DIAGNOSIS — Z7409 Other reduced mobility: Secondary | ICD-10-CM | POA: Diagnosis not present

## 2022-06-29 DIAGNOSIS — M25562 Pain in left knee: Secondary | ICD-10-CM | POA: Diagnosis not present

## 2022-06-29 DIAGNOSIS — R29898 Other symptoms and signs involving the musculoskeletal system: Secondary | ICD-10-CM | POA: Diagnosis not present

## 2022-06-29 DIAGNOSIS — M25462 Effusion, left knee: Secondary | ICD-10-CM | POA: Diagnosis not present

## 2022-07-01 DIAGNOSIS — M25662 Stiffness of left knee, not elsewhere classified: Secondary | ICD-10-CM | POA: Diagnosis not present

## 2022-07-01 DIAGNOSIS — Z7409 Other reduced mobility: Secondary | ICD-10-CM | POA: Diagnosis not present

## 2022-07-01 DIAGNOSIS — Z96652 Presence of left artificial knee joint: Secondary | ICD-10-CM | POA: Diagnosis not present

## 2022-07-01 DIAGNOSIS — M25562 Pain in left knee: Secondary | ICD-10-CM | POA: Diagnosis not present

## 2022-07-01 DIAGNOSIS — R29898 Other symptoms and signs involving the musculoskeletal system: Secondary | ICD-10-CM | POA: Diagnosis not present

## 2022-07-01 DIAGNOSIS — M25462 Effusion, left knee: Secondary | ICD-10-CM | POA: Diagnosis not present

## 2022-07-06 DIAGNOSIS — R29898 Other symptoms and signs involving the musculoskeletal system: Secondary | ICD-10-CM | POA: Diagnosis not present

## 2022-07-06 DIAGNOSIS — Z7409 Other reduced mobility: Secondary | ICD-10-CM | POA: Diagnosis not present

## 2022-07-06 DIAGNOSIS — M25562 Pain in left knee: Secondary | ICD-10-CM | POA: Diagnosis not present

## 2022-07-06 DIAGNOSIS — M25462 Effusion, left knee: Secondary | ICD-10-CM | POA: Diagnosis not present

## 2022-07-06 DIAGNOSIS — M25662 Stiffness of left knee, not elsewhere classified: Secondary | ICD-10-CM | POA: Diagnosis not present

## 2022-07-06 DIAGNOSIS — Z96652 Presence of left artificial knee joint: Secondary | ICD-10-CM | POA: Diagnosis not present

## 2022-07-09 DIAGNOSIS — Z96652 Presence of left artificial knee joint: Secondary | ICD-10-CM | POA: Diagnosis not present

## 2022-07-09 DIAGNOSIS — M25462 Effusion, left knee: Secondary | ICD-10-CM | POA: Diagnosis not present

## 2022-07-09 DIAGNOSIS — M25662 Stiffness of left knee, not elsewhere classified: Secondary | ICD-10-CM | POA: Diagnosis not present

## 2022-07-09 DIAGNOSIS — Z7409 Other reduced mobility: Secondary | ICD-10-CM | POA: Diagnosis not present

## 2022-07-09 DIAGNOSIS — M25562 Pain in left knee: Secondary | ICD-10-CM | POA: Diagnosis not present

## 2022-07-09 DIAGNOSIS — R29898 Other symptoms and signs involving the musculoskeletal system: Secondary | ICD-10-CM | POA: Diagnosis not present

## 2022-07-12 DIAGNOSIS — M25462 Effusion, left knee: Secondary | ICD-10-CM | POA: Diagnosis not present

## 2022-07-12 DIAGNOSIS — M25662 Stiffness of left knee, not elsewhere classified: Secondary | ICD-10-CM | POA: Diagnosis not present

## 2022-07-12 DIAGNOSIS — Z7409 Other reduced mobility: Secondary | ICD-10-CM | POA: Diagnosis not present

## 2022-07-12 DIAGNOSIS — R29898 Other symptoms and signs involving the musculoskeletal system: Secondary | ICD-10-CM | POA: Diagnosis not present

## 2022-07-12 DIAGNOSIS — Z96652 Presence of left artificial knee joint: Secondary | ICD-10-CM | POA: Diagnosis not present

## 2022-07-12 DIAGNOSIS — M25562 Pain in left knee: Secondary | ICD-10-CM | POA: Diagnosis not present

## 2022-07-15 DIAGNOSIS — M25662 Stiffness of left knee, not elsewhere classified: Secondary | ICD-10-CM | POA: Diagnosis not present

## 2022-07-15 DIAGNOSIS — Z7409 Other reduced mobility: Secondary | ICD-10-CM | POA: Diagnosis not present

## 2022-07-15 DIAGNOSIS — Z96652 Presence of left artificial knee joint: Secondary | ICD-10-CM | POA: Diagnosis not present

## 2022-07-15 DIAGNOSIS — R29898 Other symptoms and signs involving the musculoskeletal system: Secondary | ICD-10-CM | POA: Diagnosis not present

## 2022-07-20 DIAGNOSIS — Z7409 Other reduced mobility: Secondary | ICD-10-CM | POA: Diagnosis not present

## 2022-07-20 DIAGNOSIS — R29898 Other symptoms and signs involving the musculoskeletal system: Secondary | ICD-10-CM | POA: Diagnosis not present

## 2022-07-20 DIAGNOSIS — Z96652 Presence of left artificial knee joint: Secondary | ICD-10-CM | POA: Diagnosis not present

## 2022-07-20 DIAGNOSIS — M25662 Stiffness of left knee, not elsewhere classified: Secondary | ICD-10-CM | POA: Diagnosis not present

## 2022-07-20 DIAGNOSIS — M25462 Effusion, left knee: Secondary | ICD-10-CM | POA: Diagnosis not present

## 2022-07-20 DIAGNOSIS — M25562 Pain in left knee: Secondary | ICD-10-CM | POA: Diagnosis not present

## 2022-07-21 IMAGING — CT CT ABD-PELV W/ CM
2 of 5 series · 16 of 46 positions shown, 18 images · IV contrast (iopamidol)
Comparison: Abdominal CT dated 09/15/2017.

CLINICAL DATA: 64-year-old female with abdominal pain.

EXAM:
CT ABDOMEN AND PELVIS WITH CONTRAST
TECHNIQUE: Multidetector CT imaging of the abdomen and pelvis was performed
using the standard protocol following bolus administration of
intravenous contrast.
CONTRAST:  100mL [id number] IOPAMIDOL ([id number]) INJECTION 61%

[Series 2: abd pelvis 5.00 br40 s3 axial · axial · 0.81mm/px · z∈[+1027,+1467]mm · 13 of 98 slices shown, 15 images]
[im 5/98  soft-tissue]
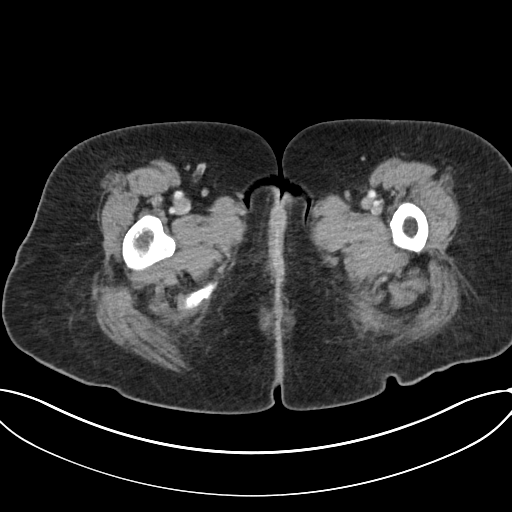
[im 5/98  bone]
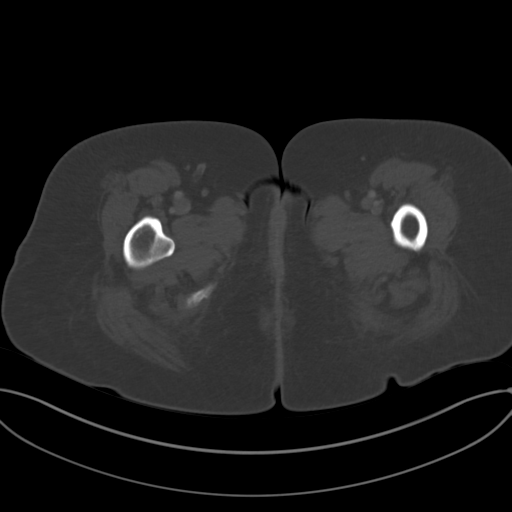
[im 15/98  soft-tissue]
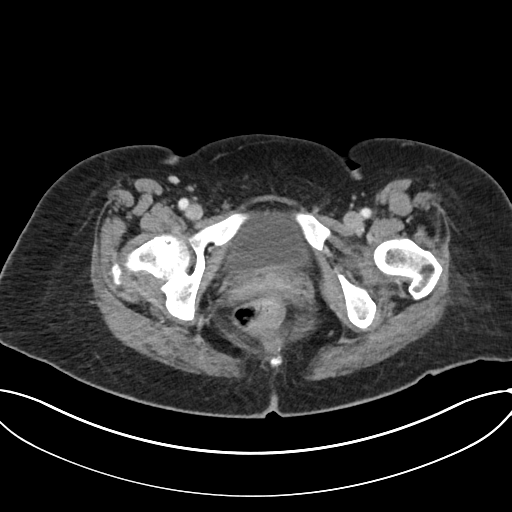
[im 20/98  soft-tissue]
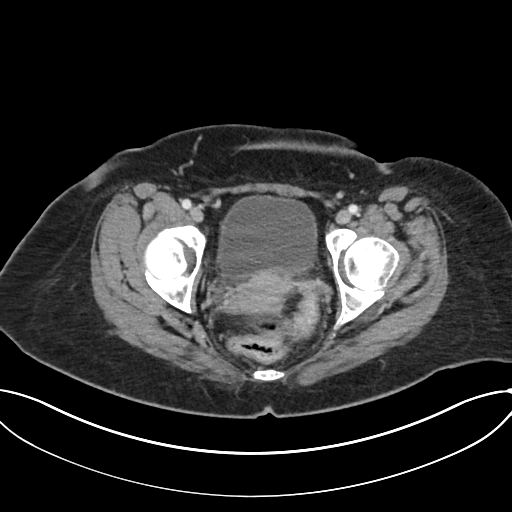
[im 30/98  soft-tissue]
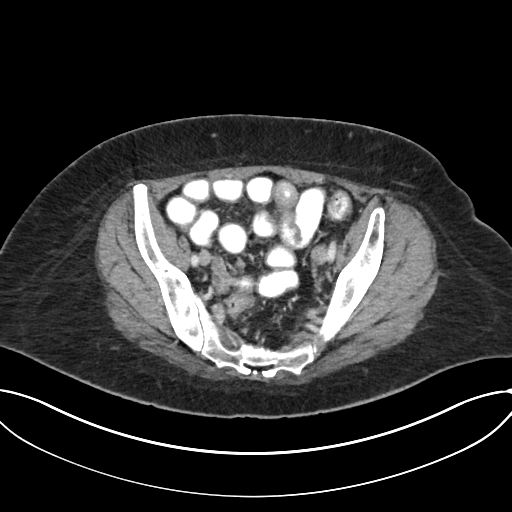
[im 34/98  soft-tissue]
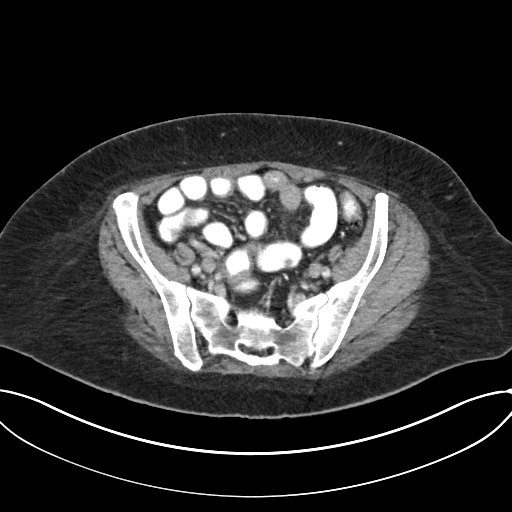
[im 44/98  soft-tissue]
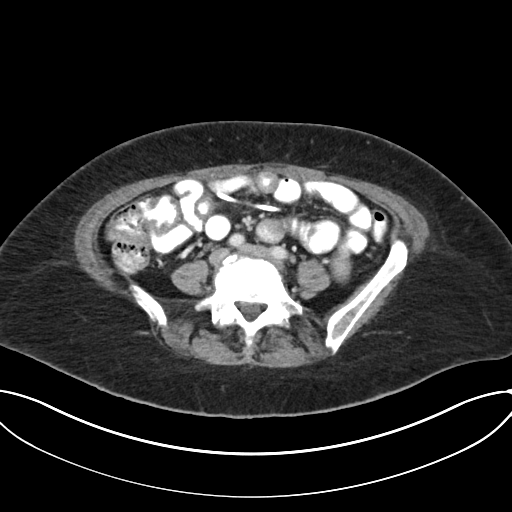
[im 49/98  soft-tissue]
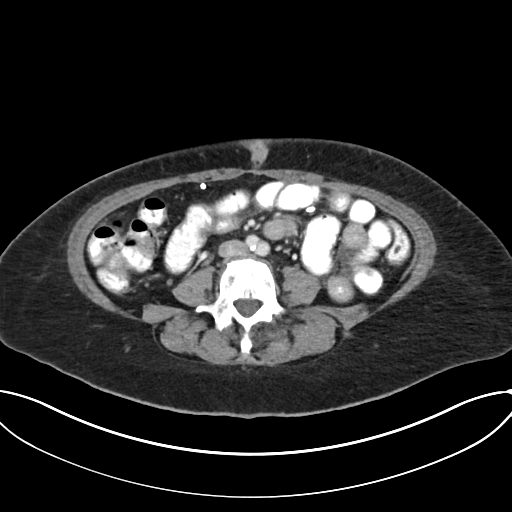
[im 54/98  soft-tissue]
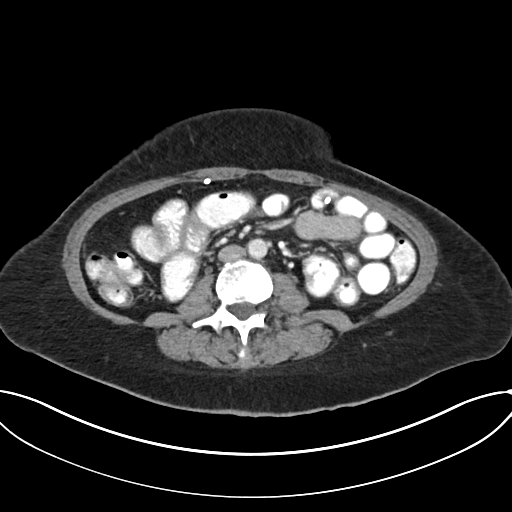
[im 64/98  soft-tissue]
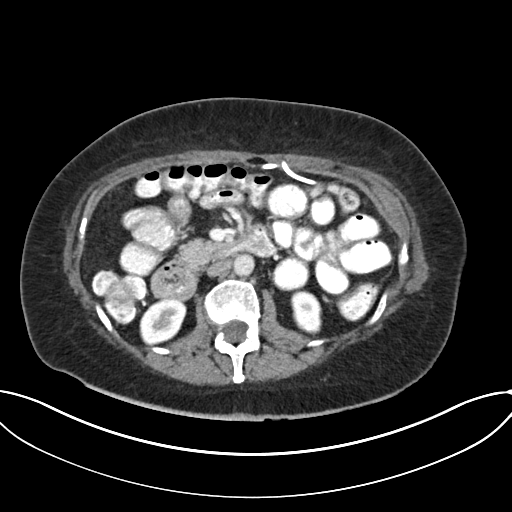
[im 64/98  bone]
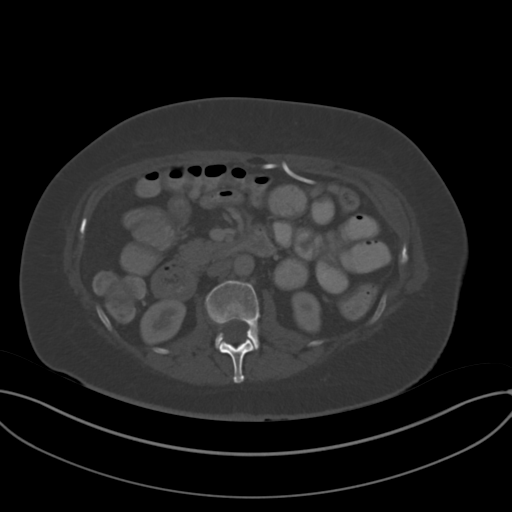
[im 68/98  soft-tissue]
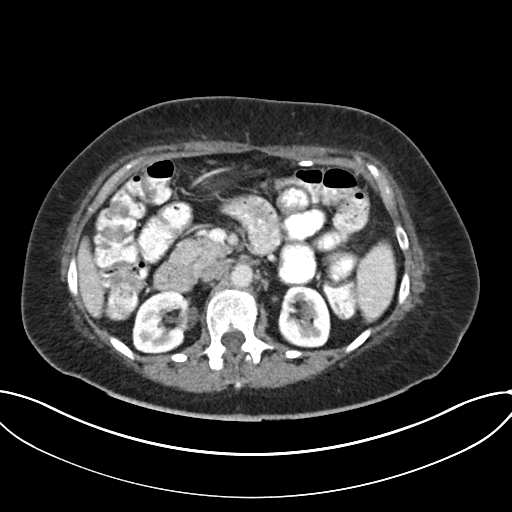
[im 78/98  soft-tissue]
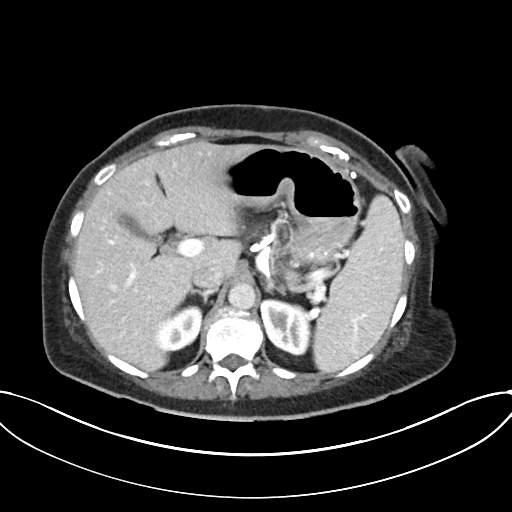
[im 83/98  soft-tissue]
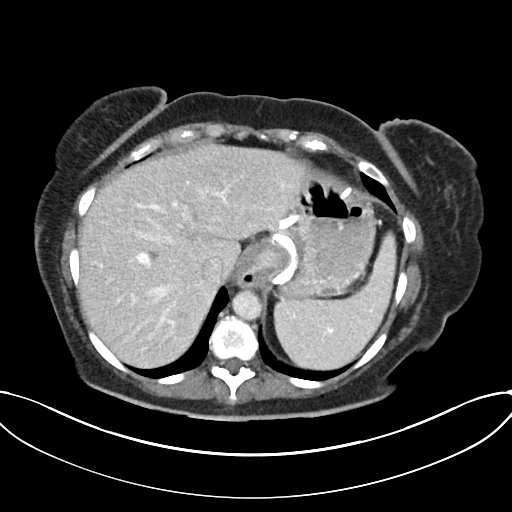
[im 93/98  soft-tissue]
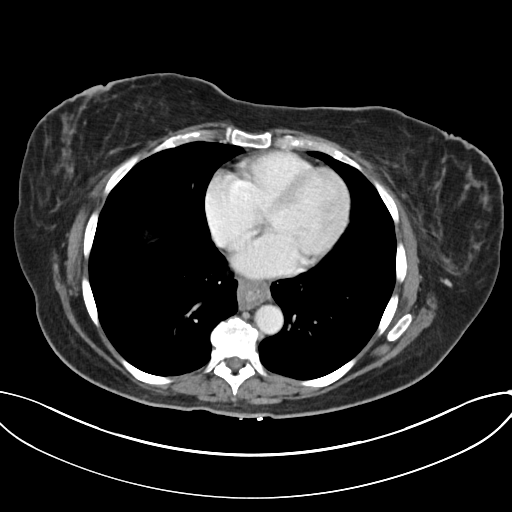

[Series 6: abd pelvis 2.00 br40 s3 cor · coronal · 0.81mm/px · 3 of 141 slices shown]
[im 47/141  soft-tissue]
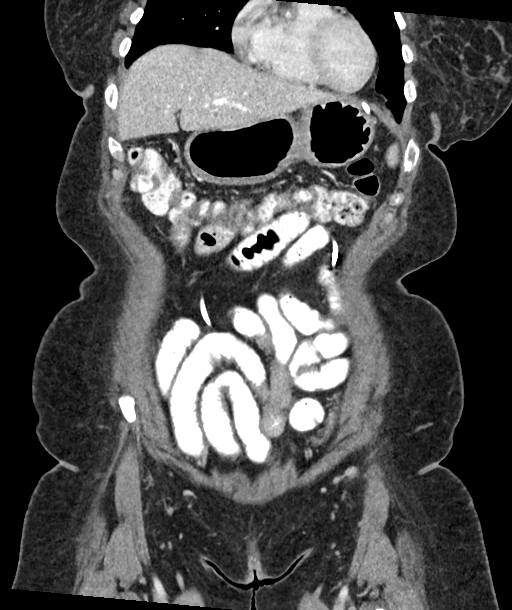
[im 63/141  soft-tissue]
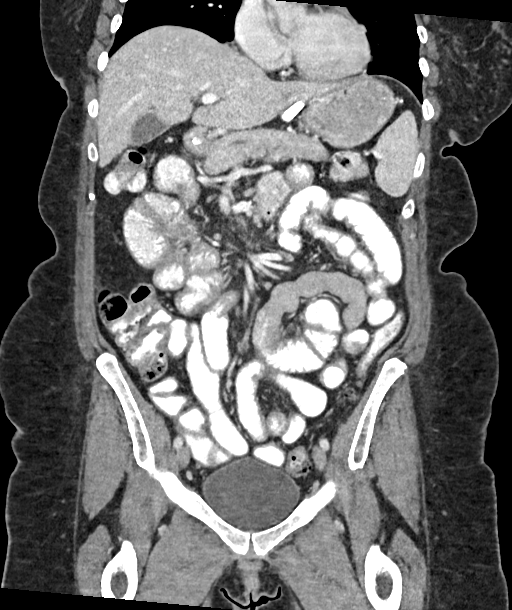
[im 78/141  soft-tissue]
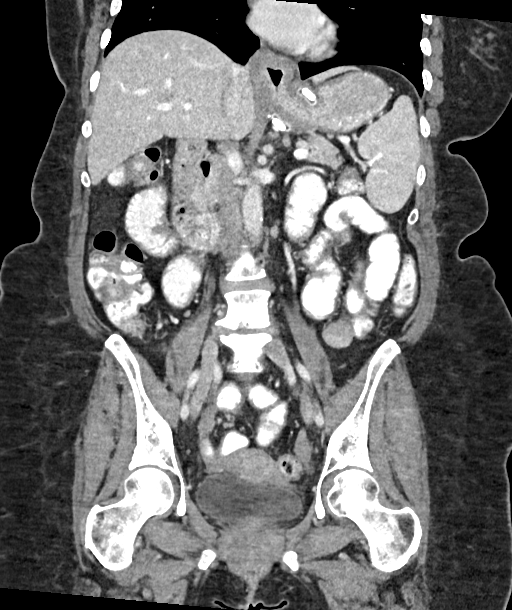

[16 of 46 positions shown; findings below may reference images not displayed]

FINDINGS: Lower chest: The visualized lung bases are clear.

No intra-abdominal free air or free fluid.

Hepatobiliary: No focal liver abnormality is seen. No gallstones,
gallbladder wall thickening, or biliary dilatation.

Pancreas: Unremarkable. No pancreatic ductal dilatation or
surrounding inflammatory changes.

Spleen: Mildly enlarged spleen measuring 15 cm in length.

Adrenals/Urinary Tract: The adrenal glands unremarkable. There is no
hydronephrosis on either side. There is symmetric enhancement and
excretion of contrast by both kidneys. The visualized ureters and
urinary bladder appear unremarkable.

Stomach/Bowel: There is gastric lap band. There is circumferential
thickening of the distal esophagus consistent with esophagitis. The
lap band appears in appropriate orientation. There is sigmoid
diverticulosis without active inflammatory changes. Mild diffuse
thickening of the wall of small bowel loops consistent with mild
enteritis. There is no bowel obstruction. The appendix is normal.

Vascular/Lymphatic: The abdominal aorta and IVC are unremarkable. No
portal venous gas. There is no adenopathy.

Reproductive: The uterus and ovaries are grossly unremarkable. No
pelvic mass.

Other: None

Musculoskeletal: Degenerative changes of the spine. No acute osseous
pathology.
IMPRESSION: 1. Esophagitis as well as diffuse mild enteritis. Clinical
correlation is recommended. No bowel obstruction. Normal appendix.
2. Sigmoid diverticulosis.
3. Mild splenomegaly.

## 2022-07-22 DIAGNOSIS — Z96652 Presence of left artificial knee joint: Secondary | ICD-10-CM | POA: Diagnosis not present

## 2022-07-22 DIAGNOSIS — R29898 Other symptoms and signs involving the musculoskeletal system: Secondary | ICD-10-CM | POA: Diagnosis not present

## 2022-07-22 DIAGNOSIS — M25662 Stiffness of left knee, not elsewhere classified: Secondary | ICD-10-CM | POA: Diagnosis not present

## 2022-07-22 DIAGNOSIS — M25462 Effusion, left knee: Secondary | ICD-10-CM | POA: Diagnosis not present

## 2022-07-22 DIAGNOSIS — M25562 Pain in left knee: Secondary | ICD-10-CM | POA: Diagnosis not present

## 2022-07-22 DIAGNOSIS — Z7409 Other reduced mobility: Secondary | ICD-10-CM | POA: Diagnosis not present

## 2022-07-28 DIAGNOSIS — Z96652 Presence of left artificial knee joint: Secondary | ICD-10-CM | POA: Diagnosis not present

## 2022-07-28 DIAGNOSIS — R29898 Other symptoms and signs involving the musculoskeletal system: Secondary | ICD-10-CM | POA: Diagnosis not present

## 2022-07-28 DIAGNOSIS — M25662 Stiffness of left knee, not elsewhere classified: Secondary | ICD-10-CM | POA: Diagnosis not present

## 2022-07-28 DIAGNOSIS — Z7409 Other reduced mobility: Secondary | ICD-10-CM | POA: Diagnosis not present

## 2022-08-05 DIAGNOSIS — R29898 Other symptoms and signs involving the musculoskeletal system: Secondary | ICD-10-CM | POA: Diagnosis not present

## 2022-08-05 DIAGNOSIS — Z96652 Presence of left artificial knee joint: Secondary | ICD-10-CM | POA: Diagnosis not present

## 2022-08-05 DIAGNOSIS — R6889 Other general symptoms and signs: Secondary | ICD-10-CM | POA: Diagnosis not present

## 2022-08-05 DIAGNOSIS — M25562 Pain in left knee: Secondary | ICD-10-CM | POA: Diagnosis not present

## 2022-08-13 DIAGNOSIS — R29898 Other symptoms and signs involving the musculoskeletal system: Secondary | ICD-10-CM | POA: Diagnosis not present

## 2022-08-13 DIAGNOSIS — Z96652 Presence of left artificial knee joint: Secondary | ICD-10-CM | POA: Diagnosis not present

## 2022-08-13 DIAGNOSIS — M25562 Pain in left knee: Secondary | ICD-10-CM | POA: Diagnosis not present

## 2022-08-13 DIAGNOSIS — R6889 Other general symptoms and signs: Secondary | ICD-10-CM | POA: Diagnosis not present

## 2022-08-17 DIAGNOSIS — Z1231 Encounter for screening mammogram for malignant neoplasm of breast: Secondary | ICD-10-CM | POA: Diagnosis not present

## 2022-08-18 DIAGNOSIS — M25562 Pain in left knee: Secondary | ICD-10-CM | POA: Diagnosis not present

## 2022-08-18 DIAGNOSIS — R6889 Other general symptoms and signs: Secondary | ICD-10-CM | POA: Diagnosis not present

## 2022-08-18 DIAGNOSIS — Z96652 Presence of left artificial knee joint: Secondary | ICD-10-CM | POA: Diagnosis not present

## 2022-08-18 DIAGNOSIS — R29898 Other symptoms and signs involving the musculoskeletal system: Secondary | ICD-10-CM | POA: Diagnosis not present

## 2022-09-02 DIAGNOSIS — E785 Hyperlipidemia, unspecified: Secondary | ICD-10-CM | POA: Diagnosis not present

## 2022-09-02 DIAGNOSIS — Z13820 Encounter for screening for osteoporosis: Secondary | ICD-10-CM | POA: Diagnosis not present

## 2022-09-02 DIAGNOSIS — E118 Type 2 diabetes mellitus with unspecified complications: Secondary | ICD-10-CM | POA: Diagnosis not present

## 2022-09-02 DIAGNOSIS — Z9884 Bariatric surgery status: Secondary | ICD-10-CM | POA: Diagnosis not present

## 2022-09-02 DIAGNOSIS — E041 Nontoxic single thyroid nodule: Secondary | ICD-10-CM | POA: Diagnosis not present

## 2022-09-02 DIAGNOSIS — M5136 Other intervertebral disc degeneration, lumbar region: Secondary | ICD-10-CM | POA: Diagnosis not present

## 2022-09-08 DIAGNOSIS — M25562 Pain in left knee: Secondary | ICD-10-CM | POA: Diagnosis not present

## 2022-09-08 DIAGNOSIS — R29898 Other symptoms and signs involving the musculoskeletal system: Secondary | ICD-10-CM | POA: Diagnosis not present

## 2022-09-08 DIAGNOSIS — R6889 Other general symptoms and signs: Secondary | ICD-10-CM | POA: Diagnosis not present

## 2022-09-08 DIAGNOSIS — Z96652 Presence of left artificial knee joint: Secondary | ICD-10-CM | POA: Diagnosis not present

## 2022-09-09 DIAGNOSIS — Z9884 Bariatric surgery status: Secondary | ICD-10-CM | POA: Diagnosis not present

## 2022-09-09 DIAGNOSIS — M5136 Other intervertebral disc degeneration, lumbar region: Secondary | ICD-10-CM | POA: Diagnosis not present

## 2022-09-09 DIAGNOSIS — E785 Hyperlipidemia, unspecified: Secondary | ICD-10-CM | POA: Diagnosis not present

## 2022-09-09 DIAGNOSIS — Z79899 Other long term (current) drug therapy: Secondary | ICD-10-CM | POA: Diagnosis not present

## 2022-09-09 DIAGNOSIS — E118 Type 2 diabetes mellitus with unspecified complications: Secondary | ICD-10-CM | POA: Diagnosis not present

## 2022-09-15 DIAGNOSIS — Z96652 Presence of left artificial knee joint: Secondary | ICD-10-CM | POA: Diagnosis not present

## 2022-09-15 DIAGNOSIS — M25562 Pain in left knee: Secondary | ICD-10-CM | POA: Diagnosis not present

## 2022-09-15 DIAGNOSIS — R29898 Other symptoms and signs involving the musculoskeletal system: Secondary | ICD-10-CM | POA: Diagnosis not present

## 2022-09-15 DIAGNOSIS — R6889 Other general symptoms and signs: Secondary | ICD-10-CM | POA: Diagnosis not present

## 2022-09-22 DIAGNOSIS — R29898 Other symptoms and signs involving the musculoskeletal system: Secondary | ICD-10-CM | POA: Diagnosis not present

## 2022-09-22 DIAGNOSIS — R6889 Other general symptoms and signs: Secondary | ICD-10-CM | POA: Diagnosis not present

## 2022-09-22 DIAGNOSIS — M25562 Pain in left knee: Secondary | ICD-10-CM | POA: Diagnosis not present

## 2022-09-22 DIAGNOSIS — Z96652 Presence of left artificial knee joint: Secondary | ICD-10-CM | POA: Diagnosis not present

## 2023-03-11 DIAGNOSIS — E785 Hyperlipidemia, unspecified: Secondary | ICD-10-CM | POA: Diagnosis not present

## 2023-03-11 DIAGNOSIS — Z79899 Other long term (current) drug therapy: Secondary | ICD-10-CM | POA: Diagnosis not present

## 2023-03-11 DIAGNOSIS — E118 Type 2 diabetes mellitus with unspecified complications: Secondary | ICD-10-CM | POA: Diagnosis not present

## 2023-03-17 DIAGNOSIS — E785 Hyperlipidemia, unspecified: Secondary | ICD-10-CM | POA: Diagnosis not present

## 2023-03-17 DIAGNOSIS — J45901 Unspecified asthma with (acute) exacerbation: Secondary | ICD-10-CM | POA: Diagnosis not present

## 2023-03-17 DIAGNOSIS — I10 Essential (primary) hypertension: Secondary | ICD-10-CM | POA: Diagnosis not present

## 2023-03-17 DIAGNOSIS — E118 Type 2 diabetes mellitus with unspecified complications: Secondary | ICD-10-CM | POA: Diagnosis not present

## 2023-03-17 DIAGNOSIS — Z0000 Encounter for general adult medical examination without abnormal findings: Secondary | ICD-10-CM | POA: Diagnosis not present

## 2023-03-21 DIAGNOSIS — H52221 Regular astigmatism, right eye: Secondary | ICD-10-CM | POA: Diagnosis not present

## 2023-05-09 DIAGNOSIS — Z860100 Personal history of colon polyps, unspecified: Secondary | ICD-10-CM | POA: Diagnosis not present

## 2023-05-09 DIAGNOSIS — Z1211 Encounter for screening for malignant neoplasm of colon: Secondary | ICD-10-CM | POA: Diagnosis not present

## 2023-05-19 DIAGNOSIS — K635 Polyp of colon: Secondary | ICD-10-CM | POA: Diagnosis not present

## 2023-05-19 DIAGNOSIS — K5730 Diverticulosis of large intestine without perforation or abscess without bleeding: Secondary | ICD-10-CM | POA: Diagnosis not present

## 2023-05-19 DIAGNOSIS — D120 Benign neoplasm of cecum: Secondary | ICD-10-CM | POA: Diagnosis not present

## 2023-05-19 DIAGNOSIS — Z860101 Personal history of adenomatous and serrated colon polyps: Secondary | ICD-10-CM | POA: Diagnosis not present

## 2023-05-19 DIAGNOSIS — Z860100 Personal history of colon polyps, unspecified: Secondary | ICD-10-CM | POA: Diagnosis not present

## 2023-05-26 DIAGNOSIS — Z96652 Presence of left artificial knee joint: Secondary | ICD-10-CM | POA: Diagnosis not present

## 2023-08-18 DIAGNOSIS — Z1231 Encounter for screening mammogram for malignant neoplasm of breast: Secondary | ICD-10-CM | POA: Diagnosis not present

## 2023-09-08 DIAGNOSIS — I10 Essential (primary) hypertension: Secondary | ICD-10-CM | POA: Diagnosis not present

## 2023-09-08 DIAGNOSIS — E785 Hyperlipidemia, unspecified: Secondary | ICD-10-CM | POA: Diagnosis not present

## 2023-09-08 DIAGNOSIS — E118 Type 2 diabetes mellitus with unspecified complications: Secondary | ICD-10-CM | POA: Diagnosis not present

## 2023-09-15 DIAGNOSIS — I10 Essential (primary) hypertension: Secondary | ICD-10-CM | POA: Diagnosis not present

## 2023-09-15 DIAGNOSIS — D721 Eosinophilia, unspecified: Secondary | ICD-10-CM | POA: Diagnosis not present

## 2023-09-15 DIAGNOSIS — E118 Type 2 diabetes mellitus with unspecified complications: Secondary | ICD-10-CM | POA: Diagnosis not present

## 2023-09-15 DIAGNOSIS — J45901 Unspecified asthma with (acute) exacerbation: Secondary | ICD-10-CM | POA: Diagnosis not present

## 2023-09-15 DIAGNOSIS — E041 Nontoxic single thyroid nodule: Secondary | ICD-10-CM | POA: Diagnosis not present

## 2023-09-15 DIAGNOSIS — E785 Hyperlipidemia, unspecified: Secondary | ICD-10-CM | POA: Diagnosis not present

## 2023-09-15 DIAGNOSIS — Z9884 Bariatric surgery status: Secondary | ICD-10-CM | POA: Diagnosis not present

## 2024-02-03 DIAGNOSIS — M25562 Pain in left knee: Secondary | ICD-10-CM | POA: Diagnosis not present

## 2024-02-03 DIAGNOSIS — S81012S Laceration without foreign body, left knee, sequela: Secondary | ICD-10-CM | POA: Diagnosis not present

## 2024-02-03 DIAGNOSIS — S81012A Laceration without foreign body, left knee, initial encounter: Secondary | ICD-10-CM | POA: Diagnosis not present

## 2024-02-13 DIAGNOSIS — Z4802 Encounter for removal of sutures: Secondary | ICD-10-CM | POA: Diagnosis not present

## 2024-02-17 DIAGNOSIS — L237 Allergic contact dermatitis due to plants, except food: Secondary | ICD-10-CM | POA: Diagnosis not present

## 2024-02-17 DIAGNOSIS — Z4802 Encounter for removal of sutures: Secondary | ICD-10-CM | POA: Diagnosis not present

## 2024-03-09 DIAGNOSIS — D721 Eosinophilia, unspecified: Secondary | ICD-10-CM | POA: Diagnosis not present

## 2024-03-09 DIAGNOSIS — E041 Nontoxic single thyroid nodule: Secondary | ICD-10-CM | POA: Diagnosis not present

## 2024-03-09 DIAGNOSIS — E785 Hyperlipidemia, unspecified: Secondary | ICD-10-CM | POA: Diagnosis not present

## 2024-03-09 DIAGNOSIS — I10 Essential (primary) hypertension: Secondary | ICD-10-CM | POA: Diagnosis not present

## 2024-03-16 DIAGNOSIS — Z0000 Encounter for general adult medical examination without abnormal findings: Secondary | ICD-10-CM | POA: Diagnosis not present

## 2024-03-16 DIAGNOSIS — J45901 Unspecified asthma with (acute) exacerbation: Secondary | ICD-10-CM | POA: Diagnosis not present

## 2024-03-16 DIAGNOSIS — E785 Hyperlipidemia, unspecified: Secondary | ICD-10-CM | POA: Diagnosis not present

## 2024-03-16 DIAGNOSIS — Z23 Encounter for immunization: Secondary | ICD-10-CM | POA: Diagnosis not present

## 2024-03-16 DIAGNOSIS — E118 Type 2 diabetes mellitus with unspecified complications: Secondary | ICD-10-CM | POA: Diagnosis not present

## 2024-03-16 DIAGNOSIS — I10 Essential (primary) hypertension: Secondary | ICD-10-CM | POA: Diagnosis not present

## 2024-04-02 DIAGNOSIS — Z0100 Encounter for examination of eyes and vision without abnormal findings: Secondary | ICD-10-CM | POA: Diagnosis not present

## 2024-04-02 DIAGNOSIS — E119 Type 2 diabetes mellitus without complications: Secondary | ICD-10-CM | POA: Diagnosis not present

## 2024-04-02 DIAGNOSIS — H52223 Regular astigmatism, bilateral: Secondary | ICD-10-CM | POA: Diagnosis not present

## 2024-04-02 DIAGNOSIS — H353121 Nonexudative age-related macular degeneration, left eye, early dry stage: Secondary | ICD-10-CM | POA: Diagnosis not present

## 2024-04-02 DIAGNOSIS — H524 Presbyopia: Secondary | ICD-10-CM | POA: Diagnosis not present

## 2024-04-02 DIAGNOSIS — H353112 Nonexudative age-related macular degeneration, right eye, intermediate dry stage: Secondary | ICD-10-CM | POA: Diagnosis not present

## 2024-04-02 DIAGNOSIS — H5212 Myopia, left eye: Secondary | ICD-10-CM | POA: Diagnosis not present

## 2024-04-02 DIAGNOSIS — H43813 Vitreous degeneration, bilateral: Secondary | ICD-10-CM | POA: Diagnosis not present
